# Patient Record
Sex: Male | Born: 1950 | Race: White | Hispanic: No | Marital: Single | Smoking: Never smoker
Health system: Southern US, Community
[De-identification: ages and names within clinical notes are randomized; demographics above are authoritative.]

## PROBLEM LIST (undated history)

## (undated) DIAGNOSIS — K222 Esophageal obstruction: Secondary | ICD-10-CM

## (undated) DIAGNOSIS — F0390 Unspecified dementia without behavioral disturbance: Secondary | ICD-10-CM

## (undated) DIAGNOSIS — R1310 Dysphagia, unspecified: Secondary | ICD-10-CM

## (undated) DIAGNOSIS — K649 Unspecified hemorrhoids: Secondary | ICD-10-CM

## (undated) DIAGNOSIS — K2270 Barrett's esophagus without dysplasia: Secondary | ICD-10-CM

## (undated) DIAGNOSIS — K449 Diaphragmatic hernia without obstruction or gangrene: Secondary | ICD-10-CM

## (undated) DIAGNOSIS — Q909 Down syndrome, unspecified: Secondary | ICD-10-CM

## (undated) DIAGNOSIS — H52209 Unspecified astigmatism, unspecified eye: Secondary | ICD-10-CM

## (undated) DIAGNOSIS — E039 Hypothyroidism, unspecified: Secondary | ICD-10-CM

## (undated) DIAGNOSIS — K219 Gastro-esophageal reflux disease without esophagitis: Secondary | ICD-10-CM

## (undated) HISTORY — DX: Gastro-esophageal reflux disease without esophagitis: K21.9

## (undated) HISTORY — DX: Unspecified hemorrhoids: K64.9

## (undated) HISTORY — DX: Dysphagia, unspecified: R13.10

## (undated) HISTORY — DX: Hypothyroidism, unspecified: E03.9

## (undated) HISTORY — DX: Diaphragmatic hernia without obstruction or gangrene: K44.9

## (undated) HISTORY — PX: OTHER SURGICAL HISTORY: SHX169

## (undated) HISTORY — DX: Esophageal obstruction: K22.2

## (undated) HISTORY — DX: Barrett's esophagus without dysplasia: K22.70

## (undated) HISTORY — DX: Down syndrome, unspecified: Q90.9

## (undated) HISTORY — DX: Unspecified dementia, unspecified severity, without behavioral disturbance, psychotic disturbance, mood disturbance, and anxiety: F03.90

---

## 2007-05-09 ENCOUNTER — Encounter: Payer: Self-pay | Admitting: Gastroenterology

## 2007-05-22 ENCOUNTER — Encounter: Payer: Self-pay | Admitting: Gastroenterology

## 2007-07-17 ENCOUNTER — Encounter: Payer: Self-pay | Admitting: Gastroenterology

## 2008-01-04 ENCOUNTER — Emergency Department (HOSPITAL_COMMUNITY): Admission: EM | Admit: 2008-01-04 | Discharge: 2008-01-04 | Payer: Self-pay | Admitting: Emergency Medicine

## 2008-12-11 DIAGNOSIS — Q909 Down syndrome, unspecified: Secondary | ICD-10-CM

## 2008-12-11 DIAGNOSIS — K649 Unspecified hemorrhoids: Secondary | ICD-10-CM

## 2008-12-11 DIAGNOSIS — E039 Hypothyroidism, unspecified: Secondary | ICD-10-CM | POA: Insufficient documentation

## 2008-12-11 DIAGNOSIS — K222 Esophageal obstruction: Secondary | ICD-10-CM | POA: Insufficient documentation

## 2008-12-11 DIAGNOSIS — K219 Gastro-esophageal reflux disease without esophagitis: Secondary | ICD-10-CM | POA: Insufficient documentation

## 2008-12-11 DIAGNOSIS — K2270 Barrett's esophagus without dysplasia: Secondary | ICD-10-CM

## 2008-12-11 DIAGNOSIS — R1310 Dysphagia, unspecified: Secondary | ICD-10-CM | POA: Insufficient documentation

## 2008-12-11 DIAGNOSIS — K449 Diaphragmatic hernia without obstruction or gangrene: Secondary | ICD-10-CM

## 2009-03-24 ENCOUNTER — Ambulatory Visit: Payer: Self-pay | Admitting: Gastroenterology

## 2009-03-26 LAB — CONVERTED CEMR LAB
ALT: 19 units/L (ref 0–53)
AST: 23 units/L (ref 0–37)
Albumin: 3.4 g/dL — ABNORMAL LOW (ref 3.5–5.2)
Basophils Absolute: 0 10*3/uL (ref 0.0–0.1)
Basophils Relative: 0.2 % (ref 0.0–3.0)
CO2: 32 meq/L (ref 19–32)
Calcium: 8.7 mg/dL (ref 8.4–10.5)
Chloride: 105 meq/L (ref 96–112)
Eosinophils Absolute: 0.1 10*3/uL (ref 0.0–0.7)
Lymphocytes Relative: 18.5 % (ref 12.0–46.0)
MCHC: 33 g/dL (ref 30.0–36.0)
Monocytes Relative: 5.8 % (ref 3.0–12.0)
Neutrophils Relative %: 73.1 % (ref 43.0–77.0)
Potassium: 4.3 meq/L (ref 3.5–5.1)
RBC: 4.2 M/uL — ABNORMAL LOW (ref 4.22–5.81)
RDW: 15.1 % — ABNORMAL HIGH (ref 11.5–14.6)

## 2009-03-27 ENCOUNTER — Ambulatory Visit (HOSPITAL_COMMUNITY): Admission: RE | Admit: 2009-03-27 | Discharge: 2009-03-27 | Payer: Self-pay | Admitting: Gastroenterology

## 2009-03-27 ENCOUNTER — Encounter: Payer: Self-pay | Admitting: Gastroenterology

## 2009-04-01 ENCOUNTER — Encounter (INDEPENDENT_AMBULATORY_CARE_PROVIDER_SITE_OTHER): Payer: Self-pay | Admitting: *Deleted

## 2009-04-01 ENCOUNTER — Encounter: Payer: Self-pay | Admitting: Gastroenterology

## 2009-05-07 ENCOUNTER — Ambulatory Visit: Payer: Self-pay | Admitting: Gastroenterology

## 2009-05-07 ENCOUNTER — Ambulatory Visit (HOSPITAL_COMMUNITY): Admission: RE | Admit: 2009-05-07 | Discharge: 2009-05-07 | Payer: Self-pay | Admitting: Gastroenterology

## 2009-05-25 ENCOUNTER — Encounter (INDEPENDENT_AMBULATORY_CARE_PROVIDER_SITE_OTHER): Payer: Self-pay | Admitting: *Deleted

## 2009-05-25 ENCOUNTER — Encounter: Payer: Self-pay | Admitting: Gastroenterology

## 2009-06-25 ENCOUNTER — Telehealth (INDEPENDENT_AMBULATORY_CARE_PROVIDER_SITE_OTHER): Payer: Self-pay | Admitting: *Deleted

## 2009-06-25 ENCOUNTER — Ambulatory Visit (HOSPITAL_COMMUNITY): Admission: RE | Admit: 2009-06-25 | Discharge: 2009-06-25 | Payer: Self-pay | Admitting: Gastroenterology

## 2009-06-25 ENCOUNTER — Encounter (INDEPENDENT_AMBULATORY_CARE_PROVIDER_SITE_OTHER): Payer: Self-pay | Admitting: *Deleted

## 2009-06-25 ENCOUNTER — Ambulatory Visit: Payer: Self-pay | Admitting: Gastroenterology

## 2009-07-23 ENCOUNTER — Ambulatory Visit: Payer: Self-pay | Admitting: Gastroenterology

## 2009-07-23 ENCOUNTER — Ambulatory Visit (HOSPITAL_COMMUNITY): Admission: RE | Admit: 2009-07-23 | Discharge: 2009-07-23 | Payer: Self-pay | Admitting: Gastroenterology

## 2009-09-22 ENCOUNTER — Telehealth (INDEPENDENT_AMBULATORY_CARE_PROVIDER_SITE_OTHER): Payer: Self-pay | Admitting: *Deleted

## 2009-09-25 ENCOUNTER — Encounter (INDEPENDENT_AMBULATORY_CARE_PROVIDER_SITE_OTHER): Payer: Self-pay | Admitting: *Deleted

## 2009-09-29 ENCOUNTER — Telehealth: Payer: Self-pay | Admitting: Gastroenterology

## 2009-09-30 ENCOUNTER — Encounter (INDEPENDENT_AMBULATORY_CARE_PROVIDER_SITE_OTHER): Payer: Self-pay | Admitting: *Deleted

## 2009-10-21 ENCOUNTER — Telehealth (INDEPENDENT_AMBULATORY_CARE_PROVIDER_SITE_OTHER): Payer: Self-pay | Admitting: *Deleted

## 2009-10-22 ENCOUNTER — Ambulatory Visit (HOSPITAL_COMMUNITY): Admission: RE | Admit: 2009-10-22 | Discharge: 2009-10-22 | Payer: Self-pay | Admitting: Gastroenterology

## 2009-10-22 ENCOUNTER — Ambulatory Visit: Payer: Self-pay | Admitting: Gastroenterology

## 2009-12-22 ENCOUNTER — Emergency Department (HOSPITAL_COMMUNITY): Admission: EM | Admit: 2009-12-22 | Discharge: 2009-12-22 | Payer: Self-pay | Admitting: Emergency Medicine

## 2010-03-23 NOTE — Letter (Signed)
Summary: EGD Instructions  [facility]  [address] [place], [place] [address]   Phone: [phone]  Fax: [phone]       Jeremy Murillo    September 19, 1950    MRN: [id number]       Procedure Day /Date:06/25/09     Arrival Time: 7:00 am       Procedure Time:8:00     Location of Procedure:                     [facility] ( Outpatient Registration)    PREPARATION FOR ENDOSCOPY   On 06/25/09  THE DAY OF THE PROCEDURE:  1.   Nothing to eat or drink is allowed after midnight the night before your procedure.    MEDICATION INSTRUCTIONS  Unless otherwise instructed, you should take regular prescription medications with a small sip of water as early as possible the morning of your procedure.             OTHER INSTRUCTIONS  You will need a responsible adult at least 59 years of age to accompany you and drive you home.   This person must remain in the waiting room during your procedure.  Wear loose fitting clothing that is easily removed.  Leave jewelry and other valuables at home.  However, you may wish to bring a book to read or an iPod/MP3 player to listen to music as you wait for your procedure to start.  Remove all body piercing jewelry and leave at home.  Total time from sign-in until discharge is approximately 2-3 hours.  You should go home directly after your procedure and rest.  You can resume normal activities the day after your procedure.  The day of your procedure you should not:   Drive   Make legal decisions   Operate machinery   Drink alcohol   Return to work  You will receive specific instructions about eating, activities and medications before you leave.    The above instructions have been reviewed and explained to me by   Chales Abrahams CMA Duncan Dull)  May 25, 2009 8:57 AM     I fully understand and can verbalize these instructions over the phone to [facility] and mailed to home Date 05/25/09

## 2010-03-23 NOTE — Assessment & Plan Note (Signed)
History of Present Illness Visit Type: Initial Consult Primary GI MD: Rob Bunting MD Primary Provider: Florentina Jenny, MD Requesting Provider: Florentina Jenny, MD Chief Complaint: dysphagia History of Present Illness:     Very pleasant 60 year old man with Down's syndrome who is here with his niece today. She tells me that he has the mental capacity of 80-year-old. He is unable to really give any reliable history and so she provides that.  niece tells me he has had esophageal narrowing in past.  Used to live in [place], has been living in assisted living.  She believes his weight has decreased.  Speech therapy at facility saw him, has been working with him with eating modifications.    He has not had formal MBSStudy.               Current Medications (verified): 1)  Levothroid 125 Mcg Tabs (Levothyroxine Sodium) .... Take 1 Tablet By Mouth Once A Day 2)  Prilosec 20 Mg Cpdr (Omeprazole) .Marland Kitchen.. 1 By Mouth Two Times A Day 3)  Zocor 5 Mg Tabs (Simvastatin) .Marland Kitchen.. 1 By Mouth Once Daily 4)  Acetaminophen 325 Mg Tabs (Acetaminophen) .... Take 2 Tablets By Mouth Every 6 Hours As Needed For Pain 5)  Pepto-Bismol 262 Mg/80ml Susp (Bismuth Subsalicylate) .... 30 Ml By Mouth Every 4 Hours As Needed 6)  Bactericin 500 Unit/gm Oint (Bacitracin) .... Apply To Rt Anterior Foot Lesion Two Times A Day 7)  Benadryl Maximum Strength 2 % Crea (Diphenhydramine Hcl) .... Apply To Lower Legs Four Times A Day 8)  Mineral Oil Heavy  Oil (Mineral Oil) .... Apply To Both Ears Once Weekly 9)  Cepastat 14.5 Mg Lozg (Throat Lozenges) .... Dissolve 1 Lozenge By Mouth Every 4 Hours As Needed For Sore Throat 10)  Polyethylene Glycol 1000  Powd (Polyethylene Glycol 1000) .Marland KitchenMarland KitchenMarland Kitchen 17 Gm Daily As Needed  Allergies (verified): No Known Drug Allergies  Past History:  Past Medical History: OWNS SYNDROME (ICD-758.0) HEMORRHOIDS (ICD-455.6) ESOPHAGEAL STRICTURE (ICD-530.3) ESOPHAGEAL REFLUX (ICD-530.81) DIAPHRAGMAT HERN  W/O MENTION OBSTRUCTION/GANGREN (ICD-553.3) BARRETTS ESOPHAGUS (ICD-530.85) HYPOTHYROIDISM (ICD-244.9) DYSPHAGIA UNSPECIFIED (ICD-787.20)   Family History: Family History of Heart Disease: father Alzheimers: mother  Social History: single, lives extended care facility, no children, does not drink alcohol or smoke cigarettes.  Review of Systems       Pertinent positive and negative review of systems were noted in the above HPI and GI specific review of systems.  All other review of systems was otherwise negative.   Vital Signs:  Patient profile:   60 year old male Height:      58.5 inches Weight:      107.13 pounds BMI:     22.09 Pulse rate:   68 / minute Pulse rhythm:   regular BP sitting:   102 / 58  (left arm) Cuff size:   regular  Vitals Entered By: June McMurray CMA Duncan Dull) (March 24, 2009 1:38 PM)  Physical Exam  Additional Exam:  Constitutional:age appropriate, clearly has Down syndrome Psychiatric: alert and oriented x1 Eyes: extraocular movements intact Mouth: oropharynx moist, no lesions Neck: supple, no lymphadenopathy Cardiovascular: heart regular rate and rythm Lungs: CTA bilaterally Abdomen: soft, non-tender, non-distended, no obvious ascites, no peritoneal signs, normal bowel sounds Extremities: no lower extremity edema bilaterally Skin: no lesions on visible extremities    Impression & Recommendations:  Problem # 1:  dysphasia, trouble swallowing, weight loss it is very hard to tell exactly what his complaints are, he cannot give any reliable  history. He has been working with a Human resources officer at his care facility and his weight has been increasing somewhat however I do not see that he has had a formal modified barium swallow study. His niece also seems to remember that he has had a narrowing in his esophagus that has had to be stretched in the past. I would like to set him up with modified barium swallow study as well as upper GI, esophagram  examination. Based on this I will potentially proceed with EGD if a esophageal narrowing is noted. You also get a basic set of labs included a CBC, complete metabolic profile.  Other Orders: TLB-CBC Platelet - w/Differential (85025-CBCD) TLB-CMP (Comprehensive Metabolic Pnl) (80053-COMP)  Patient Instructions: 1)  Needs UGI barium test and also modified barium swallow study with speech therapist. 2)  You will get lab test(s) done today (cbc, cmet). 3)  A copy of this information will be sent to Dr. Redmond School. 4)  The medication list was reviewed and reconciled.  All changed / newly prescribed medications were explained.  A complete medication list was provided to the patient / caregiver.  Appended Document: Orders Update/UGI / MBS with Speech    Clinical Lists Changes  Orders: Added new Test order of Barium Swallow, Modified  (Modified BS) - Signed Added new Test order of UGI Series (UGI Series) - Signed

## 2010-03-23 NOTE — Miscellaneous (Signed)
Summary: Orders Update/EGD  Clinical Lists Changes  Orders: Added new Test order of ZEGD (ZEGD) - Signed

## 2010-03-23 NOTE — Procedures (Signed)
Summary: Upper Endoscopy  Patient: Jeremy Murillo Note: All result statuses are Final unless otherwise noted.  Tests: (1) Upper Endoscopy (EGD)   EGD Upper Endoscopy       DONE     [facility]     [address] [place], [place]  [address]           ENDOSCOPY PROCEDURE REPORT           PATIENT:  Jeremy Murillo, Jeremy Murillo  MR#:  [id number]     BIRTHDATE:  May 12, 1950, 58 yrs. old  GENDER:  male     ENDOSCOPIST:  Rachael Fee, MD     PROCEDURE DATE:  06/25/2009     PROCEDURE:  EGD with balloon dilatation     ASA CLASS:  Class III     INDICATIONS:  known distal esophageal stricture (thick mucosal     ring vs focal peptic stricture), last dilated 5 6 weeks ago up to     15mm     MEDICATIONS:  MAC sedation, administered by CRNA     TOPICAL ANESTHETIC:  none     DESCRIPTION OF PROCEDURE:   After the risks benefits and     alternatives of the procedure were thoroughly explained, informed     consent was obtained.  The  endoscope was introduced through the     mouth and advanced to the stomach antrum, without limitations.     The instrument was slowly withdrawn as the mucosa was fully     examined.     <<PROCEDUREIMAGES>>     The previously known thick mucosal ring vs focal peptic stricture     was again located at GE junction above a small hiatal hernia. The     gastroscope was able to be advanced through the stricture this     time. The stricture was disrupted with two bites of forceps and     then dilated up to 16.38mm with CRE TTS balloon held inflated for 1     minute. There was the usual minor self limited bleeding following     dilation (see image1, image5, and image6).  There were anatomic     changes of remote fundoplication (see image3).    Retroflexed     views revealed no abnormalities.    The scope was then withdrawn     from the patient and the procedure completed.           COMPLICATIONS:  None           ENDOSCOPIC IMPRESSION:     1) GE junction, benign  stricture.  Dilated up to 16.43mm after     mucosal biopsies taken to disrupt the stricture.     2) S/p fundoplication remotely.           RECOMMENDATIONS:     Dr. Christella Hartigan' office will arrange for repeat EGD with dilation at     [facility] in 4 weeks.           ______________________________     Rachael Fee, MD           cc: Florentina Jenny, MD           n.     eSIGNED:   Rachael Fee at 06/25/2009 08:36 AM           Shyrl Numbers, [id number]  Note: An exclamation mark (!) indicates a result that was not dispersed into the flowsheet. Document Creation Date:  06/25/2009 8:36 AM _______________________________________________________________________  (1) Order result status: Final Collection or observation date-time: 06/25/2009 08:27 Requested date-time:  Receipt date-time:  Reported date-time:  Referring Physician:   Ordering Physician: Rob Bunting [phone]) Specimen Source:  Source: Launa Grill Order Number: [id number] Lab site:   Appended Document: Upper Endoscopy patty, he will need repeat EGD with dilation, propofol at [facility] in 4 weeks  tahnks  Appended Document: Upper Endoscopy patty, it is easiest to do him first case in morning, can you call family to arrange in next few days so that he can get a first case spot in 4-5 weeks.  Appended Document: Upper Endoscopy appt scheduled and mailed to the pt home

## 2010-03-23 NOTE — Progress Notes (Signed)
Summary: call return  Phone Note Call from Patient Call back at [phone]   Caller: Victorino Dike, neice Call For: Dr. Christella Hartigan Reason for Call: Talk to Nurse Summary of Call: returning your call, Patty Initial call taken by: Vallarie Mare,  September 29, 2009 11:10 AM  Follow-up for Phone Call        called the niece and scheduled the EGD pt instructed and meds reviewed. Follow-up by: Chales Abrahams CMA Duncan Dull),  September 30, 2009 3:21 PM

## 2010-03-23 NOTE — Letter (Signed)
Summary: EGD Instructions  [facility]  [address] [place], [place] [address]   Phone: [phone]  Fax: [phone]       Jeremy Murillo    10/15/1950    MRN: [id number]       Procedure Day /Date:05/07/09     Arrival Time:730 am     Procedure Time:830 am     Location of Procedure:                     _X  _ [facility] ( Outpatient Registration)    PREPARATION FOR ENDOSCOPY   On 05/07/09  THE DAY OF THE PROCEDURE:  1.   No solid foods, milk or milk products are allowed after midnight the night before your procedure.  2.   Do not drink anything colored red or purple.  Avoid juices with pulp.  No orange juice.  3.  You may not have anything to eat or drink after midnight before your procedure.                                                                                                CLEAR LIQUIDS INCLUDE: Water Jello Ice Popsicles Tea (sugar ok, no milk/cream) Powdered fruit flavored drinks Coffee (sugar ok, no milk/cream) Gatorade Juice: apple, white grape, white cranberry  Lemonade Clear bullion, consomm, broth Carbonated beverages (any kind) Strained chicken noodle soup Hard Candy   MEDICATION INSTRUCTIONS  Unless otherwise instructed, you should take regular prescription medications with a small sip of water as early as possible the morning of your procedure.               OTHER INSTRUCTIONS  You will need a responsible adult at least 59 years of age to accompany you and drive you home.   This person must remain in the waiting room during your procedure.  Wear loose fitting clothing that is easily removed.  Leave jewelry and other valuables at home.  However, you may wish to bring a book to read or an iPod/MP3 player to listen to music as you wait for your procedure to start.  Remove all body piercing jewelry and leave at home.  Total time from sign-in until discharge is approximately 2-3 hours.  You should go home  directly after your procedure and rest.  You can resume normal activities the day after your procedure.  The day of your procedure you should not:   Drive   Make legal decisions   Operate machinery   Drink alcohol   Return to work  You will receive specific instructions about eating, activities and medications before you leave.    The above instructions have been reviewed and explained to me by   Chales Abrahams CMA (AAMA)  April 01, 2009 11:10 AM     I fully understand and can verbalize these instructions over the phone to pt's sister. Date 04/01/09     Appended Document: EGD Instructions mailed to pt's sister  Appended Document: EGD Instructions faxed to [facility]

## 2010-03-23 NOTE — Letter (Signed)
Summary: Endoscopy Letter  [facility]  [address] [place], [place] [address]   Phone: [phone]  Fax: [phone]      September 25, 2009 MRN: [id number]   [facility] [place] [address] [place], [place]  [address]   Dear Mr. CAPOZZI,   According to your medical record, it is time for you to schedule an Endoscopy at [facility]. Endoscopic screening is recommended for patients with certain upper digestive tract conditions because of associated increased risk for cancers of the upper digestive system.  This letter has been generated based on the recommendations made at the time of your prior procedure. If you feel that in your particular situation this may no longer apply, please contact our office.  Please call our office at [phone]) to schedule this appointment or to update your records at your earliest convenience.  Thank you for cooperating with [place] to provide you with the very best care possible.   Sincerely,    [facility] [phone]

## 2010-03-23 NOTE — Procedures (Signed)
Summary: Upper Endoscopy  Patient: [name] Note: All result statuses are Final unless otherwise noted.  Tests: (1) Upper Endoscopy (EGD)   EGD Upper Endoscopy       DONE     [facility]     [address] [place], [place]  [address]           ENDOSCOPY PROCEDURE REPORT           PATIENT:  Jeremy Murillo, Jeremy Murillo  MR#:  [id number]     BIRTHDATE:  November 28, 1950, 59 yrs. old  GENDER:  male     ENDOSCOPIST:  Rachael Fee, MD     PROCEDURE DATE:  10/22/2009     PROCEDURE:  EGD with balloon dilatation     ASA CLASS:  Class III     INDICATIONS:  GE junction benign stricture, last dilated 3 months     ago to 18mm     MEDICATIONS:  MAC sedation, administered by CRNA     TOPICAL ANESTHETIC:  none           DESCRIPTION OF PROCEDURE:   After the risks benefits and     alternatives of the procedure were thoroughly explained, informed     consent was obtained.  The  endoscope was introduced through the     mouth and advanced to the stomach body, without limitations.  The     instrument was slowly withdrawn as the mucosa was fully examined.     <<PROCEDUREIMAGES>>     The focal, benign GE junction stricture (peptic vs. thick     Schatzki's ring) was again located. Lumen was 12mm and it was     dilated up to 18mm with CRE TTS balloon held inflated for one     minute. There was the usual minor mucosal tear and self limited     oozing of blood (see image1, image3, and image5).    Retroflexed     views revealed no abnormalities.    The scope was then withdrawn     from the patient and the procedure completed.           COMPLICATIONS:  None           ENDOSCOPIC IMPRESSION:     1) Benign GE junction sticture dilated up to 18mm           RECOMMENDATIONS:     Family knows to call Dr. Christella Hartigan' office if dysphagia symptoms     return. Would repeat EGD, dilation at [facility] with MAC sedation if that     occurs.           REPEAT EXAM:  as needed           ______________________________     Rachael Fee, MD           n.     eSIGNED:   Rachael Fee at 10/22/2009 11:49 AM           Shyrl Numbers, [id number]  Note: An exclamation mark (!) indicates a result that was not dispersed into the flowsheet. Document Creation Date: 10/22/2009 11:50 AM _______________________________________________________________________  (1) Order result status: Final Collection or observation date-time: 10/22/2009 11:42 Requested date-time:  Receipt date-time:  Reported date-time:  Referring Physician:   Ordering Physician: Rob Bunting [phone]) Specimen Source:  Source: Launa Grill Order Number: [id number] Lab site:

## 2010-03-23 NOTE — Progress Notes (Signed)
Summary: Appt time change  Phone Note Outgoing Call   Call placed by: Chales Abrahams CMA Duncan Dull),  October 21, 2009 3:39 PM Summary of Call: Feliberto Harts the pt's guardian and informed her that the pt does not need to arrive at [facility] for his procedure until 930 due to a schedule change on her message machine I asked her to call me and let me know she got the message.  karen at endo has changed the appt time. Initial call taken by: Chales Abrahams CMA Duncan Dull),  October 21, 2009 3:42 PM  Follow-up for Phone Call        pt guardian called back and is agreeable to the time change Follow-up by: Chales Abrahams CMA Duncan Dull),  October 21, 2009 3:49 PM

## 2010-03-23 NOTE — Letter (Signed)
Summary: [facility]   Imported By: Lester Holliday 05/11/2009 08:58:21  _____________________________________________________________________  External Attachment:    Type:   Image     Comment:   External Document

## 2010-03-23 NOTE — Procedures (Signed)
Summary: Upper Endoscopy  Patient: Jeremy Murillo Note: All result statuses are Final unless otherwise noted.  Tests: (1) Upper Endoscopy (EGD)   EGD Upper Endoscopy       DONE     [facility]     [address] [place], [place]  [address]           ENDOSCOPY PROCEDURE REPORT           PATIENT:  Jeremy Murillo, Jeremy Murillo  MR#:  [id number]     BIRTHDATE:  February 14, 1951, 58 yrs. old  GENDER:  male           ENDOSCOPIST:  Rachael Fee, MD     referred by:  Florentina Jenny, MD           PROCEDURE DATE:  05/07/2009     PROCEDURE:  EGD with balloon dilatation     ASA CLASS:  Class III     INDICATIONS:  chronic, benign, esophageal stricture; dilated     numerous times by previous out of state gastroenterologist,     recurrent dysphagia.           MEDICATIONS:  MAC sedation, administered by CRNA     TOPICAL ANESTHETIC:  none           DESCRIPTION OF PROCEDURE:   After the risks benefits and     alternatives of the procedure were thoroughly explained, informed     consent was obtained.  The  endoscope was introduced through the     mouth and advanced to the second portion of the duodenum, without     limitations.  The instrument was slowly withdrawn as the mucosa     was fully examined.     <<PROCEDUREIMAGES>>           There was a thick Schatzki's ring vs. focal peptic stricture at GE     junction. Below this there was a slight hiatal hernia and it     appeared that he has had a fundoplication sugically. The stricture     was just able to be passed with adult upper gastroscope (9-37mm     lumen) and was dilated with a CRE, TTS balloon inflated up to     15mm. This resulted in no bleeding or mucosal tear and so I     elected to disrupt the ring of thick tissue with single forceps     biopsy and then repeat balloon dilation up to 15mm for one minute.     There was the usual resultant minor self limited oozing and     mucosal tear (see image1, image4, image7, image8, and image9).     Otherwise the examination was normal (see image3 and image2).     Retroflexed views revealed no abnormalities.    The scope was then     withdrawn from the patient and the procedure completed.           COMPLICATIONS:  None           ENDOSCOPIC IMPRESSION:     1) Thick Schatzki's ring vs focal peptic stricture. This was     disrupted with forceps and then dilated up to 15mm with CRE TTS     balloon.     2) Otherwise normal examination           RECOMMENDATIONS:     My office will arrange for repeat EGD with dilation in 3-4     weeks.  He will stay on prilosec twice daily.           ______________________________     Rachael Fee, MD           n.     eSIGNED:   Rachael Fee at 05/07/2009 08:52 AM           Shyrl Numbers, [id number]  Note: An exclamation mark (!) indicates a result that was not dispersed into the flowsheet. Document Creation Date: 05/07/2009 8:52 AM _______________________________________________________________________  (1) Order result status: Final Collection or observation date-time: 05/07/2009 08:37 Requested date-time:  Receipt date-time:  Reported date-time:  Referring Physician:   Ordering Physician: Rob Bunting [phone]) Specimen Source:  Source: Launa Grill Order Number: [id number] Lab site:   Appended Document: Upper Endoscopy patty,  he needs repeat EGD with balloon dilation at [facility]/propofol in 3-4 weeks  Appended Document: Upper Endoscopy reminder flag to be sent  Appended Document: Upper Endoscopy called the home number and the guardian asked me to call his sister to schedule the EGD Victorino Dike [phone].  left message on machine to call back   Appended Document: Upper Endoscopy pt scheduled and instructed meds reviewed spoke with Victorino Dike [phone]

## 2010-03-23 NOTE — Procedures (Signed)
Summary: Upper Endoscopy  Patient: Jeremy Murillo Note: All result statuses are Final unless otherwise noted.  Tests: (1) Upper Endoscopy (EGD)   EGD Upper Endoscopy       DONE     [facility]     [address] [place], [place]  [address]           ENDOSCOPY PROCEDURE REPORT           PATIENT:  [name], [name]  MR#:  [id number]     BIRTHDATE:  10/21/1950, 58 yrs. old  GENDER:  male     ENDOSCOPIST:  Rachael Fee, MD     PROCEDURE DATE:  07/23/2009     PROCEDURE:  EGD with balloon dilatation     ASA CLASS:  Class III     INDICATIONS:  known GE junction stricture, dysphagia.  In past 2-3     months, has had dilation twice, most recently was 4 weeks ago up     to 16.51mm.     MEDICATIONS:  MAC sedation, administered by CRNA     TOPICAL ANESTHETIC:  none           DESCRIPTION OF PROCEDURE:   After the risks benefits and     alternatives of the procedure were thoroughly explained, informed     consent was obtained.  The  endoscope was introduced through the     mouth and advanced to the stomach body, without limitations.  The     instrument was slowly withdrawn as the mucosa was fully examined.     <<PROCEDUREIMAGES>>     The GE junction stricture was noted above a 3-4cm hiatal hernia.     There was evidence of previous fundoplication surgery. The     stricture was benign, focal (a thick Schatzki's ring vs focal     peptic stricture) and was dilated up to 18mm with CRE TTS balloon     held inflated for 1 minute (see image3, image1, image4, image6,     and image7).  Otherwise the examination was normal (see image5).     Retroflexed views revealed no abnormalities.    The scope was then     withdrawn from the patient and the procedure completed.     COMPLICATIONS:  None           ENDOSCOPIC IMPRESSION:     1) Benign GE junction stricture dilated again (up to 18mm with     CRE Balloon).     2) Small hiatal hernia and previous fundoplication     3) Otherwise  normal examination           RECOMMENDATIONS:     Dr. Christella Hartigan' office will arrange for repeat EGD at [facility] with     balloon dilation in 2 months.           ______________________________     Rachael Fee, MD           n.     eSIGNED:   Rachael Fee at 07/23/2009 07:46 AM           Shyrl Numbers, [id number]  Note: An exclamation mark (!) indicates a result that was not dispersed into the flowsheet. Document Creation Date: 07/23/2009 7:47 AM _______________________________________________________________________  (1) Order result status: Final Collection or observation date-time: 07/23/2009 07:38 Requested date-time:  Receipt date-time:  Reported date-time:  Referring Physician:   Ordering Physician: Rob Bunting [phone]) Specimen Source:  Source: Launa Grill  Order Number: [id number] Lab site:   Appended Document: Upper Endoscopy repeat egd at [facility] with balloon dilation in 2 months  Appended Document: Upper Endoscopy flag to schedule in 2 months

## 2010-03-23 NOTE — Procedures (Signed)
Summary: EGD/[facility]  EGD/[facility]   Imported By: Lester Nunda 05/11/2009 09:01:10  _____________________________________________________________________  External Attachment:    Type:   Image     Comment:   External Document

## 2010-03-23 NOTE — Progress Notes (Signed)
Summary: Repeat EGD  Phone Note Outgoing Call Call back at [phone]   Call placed by: Chales Abrahams CMA Duncan Dull),  September 22, 2009 9:07 AM Call placed to: Victorino Dike Summary of Call: Hazeline Junker to schedule repeat Procedure left message on machine to call back  Initial call taken by: Chales Abrahams CMA Duncan Dull),  September 22, 2009 9:07 AM  Follow-up for Phone Call        left message on machine to call back Chales Abrahams CMA Duncan Dull)  September 24, 2009 3:43 PM   letter mailed Follow-up by: Chales Abrahams CMA Duncan Dull),  September 25, 2009 2:53 PM

## 2010-03-23 NOTE — Miscellaneous (Signed)
Summary: Orders Update/EGD  Clinical Lists Changes  Orders: Added new Test order of ZEGD Balloon Dil (ZEGD Balloon) - Signed

## 2010-03-23 NOTE — Letter (Signed)
Summary: [facility]   Imported By: Lester Dacula 05/11/2009 08:59:15  _____________________________________________________________________  External Attachment:    Type:   Image     Comment:   External Document

## 2010-03-23 NOTE — Letter (Signed)
Summary: EGD Instructions  [facility]  [address] [place], [place] [address]   Phone: [phone]  Fax: [phone]       Jeremy Murillo    24-Jul-1950    MRN: [id number]       Procedure Day /Date:07/23/09     Arrival Time: 630 am     Procedure Time:730 am     Location of Procedure:                     [facility] ( Outpatient Registration)   Please arrive at [facility] for your preappointment on 07/22/09 at 1 pm.   PREPARATION FOR ENDOSCOPY   On 07/23/09  THE DAY OF THE PROCEDURE:  1.   Nothing to eat or drink allowed after midnight the night before your procedure.                                                                                                      MEDICATION INSTRUCTIONS  Unless otherwise instructed, you should take regular prescription medications with a small sip of water as early as possible the morning of your procedure.             OTHER INSTRUCTIONS  You will need a responsible adult at least 59 years of age to accompany you and drive you home.   This person must remain in the waiting room during your procedure.  Wear loose fitting clothing that is easily removed.  Leave jewelry and other valuables at home.  However, you may wish to bring a book to read or an iPod/MP3 player to listen to music as you wait for your procedure to start.  Remove all body piercing jewelry and leave at home.  Total time from sign-in until discharge is approximately 2-3 hours.  You should go home directly after your procedure and rest.  You can resume normal activities the day after your procedure.  The day of your procedure you should not:   Drive   Make legal decisions   Operate machinery   Drink alcohol   Return to work  You will receive specific instructions about eating, activities and medications before you leave.    The above instructions have been reviewed and explained to me by   Chales Abrahams  CMA Duncan Dull)  Jun 25, 2009 9:16 AM     I fully understand and can verbalize these instructions over the phone mailed tohome Date 06/25/09

## 2010-03-23 NOTE — Letter (Signed)
Summary: EGD Instructions  [facility]  [address] [place], [place] [address]   Phone: [phone]  Fax: [phone]       Jeremy Murillo    03-11-1950    MRN: [id number]       Procedure Day /Date: 10/22/09 THURS     Arrival Time: 7 am     Procedure Time:8 am     Location of Procedure:                     [facility] ( Outpatient Registration)   PREPARATION FOR ENDOSCOPY   On 10/22/09  THE DAY OF THE PROCEDURE:  1.   No solid foods, milk or milk products are allowed after midnight the night before your procedure.  2.   Do not drink anything colored red or purple.  Avoid juices with pulp.  No orange juice.  3.  You may drink clear liquids until 4 am, which is 4 hours before your procedure.                                                                                                CLEAR LIQUIDS INCLUDE: Water Jello Ice Popsicles Tea (sugar ok, no milk/cream) Powdered fruit flavored drinks Coffee (sugar ok, no milk/cream) Gatorade Juice: apple, white grape, white cranberry  Lemonade Clear bullion, consomm, broth Carbonated beverages (any kind) Strained chicken noodle soup Hard Candy   MEDICATION INSTRUCTIONS  Unless otherwise instructed, you should take regular prescription medications with a small sip of water as early as possible the morning of your procedure.             OTHER INSTRUCTIONS  You will need a responsible adult at least 59 years of age to accompany you and drive you home.   This person must remain in the waiting room during your procedure.  Wear loose fitting clothing that is easily removed.  Leave jewelry and other valuables at home.  However, you may wish to bring a book to read or an iPod/MP3 player to listen to music as you wait for your procedure to start.  Remove all body piercing jewelry and leave at home.  Total time from sign-in until discharge is approximately 2-3 hours.  You should go home  directly after your procedure and rest.  You can resume normal activities the day after your procedure.  The day of your procedure you should not:   Drive   Make legal decisions   Operate machinery   Drink alcohol   Return to work  You will receive specific instructions about eating, activities and medications before you leave.    The above instructions have been reviewed and explained to me by  Chales Abrahams CMA Duncan Dull)  September 30, 2009 3:20 PM     I fully understand and can verbalize these instructions over the phone mailed to home Date 09/30/09

## 2010-03-23 NOTE — Progress Notes (Signed)
Summary: EGD  Phone Note Outgoing Call Call back at [facility] Phone [phone]   Call placed by: Chales Abrahams CMA Duncan Dull),  Jun 25, 2009 9:13 AM Summary of Call: pt scheduled for repeat EGD with Dil propofol, previsit appt on 07/22/09 1 pm.   Initial call taken by: Chales Abrahams CMA Duncan Dull),  Jun 25, 2009 9:13 AM  Follow-up for Phone Call        Spoke with the guardian Loraine Leriche and he request I call his sister Victorino Dike and give her the info.  I spoke with Victorino Dike and she was instructed and info mailed to her. Follow-up by: Chales Abrahams CMA Duncan Dull),  Jun 29, 2009 9:12 AM

## 2010-03-23 NOTE — Miscellaneous (Signed)
Summary: [facility]  [facility]   Imported By: Lester Norway 03/27/2009 10:22:47  _____________________________________________________________________  External Attachment:    Type:   Image     Comment:   External Document

## 2010-05-03 ENCOUNTER — Ambulatory Visit
Admission: RE | Admit: 2010-05-03 | Discharge: 2010-05-03 | Disposition: A | Discharge status: Home / Self Care | Payer: Medicare Other | Source: Ambulatory Visit | Attending: Family Medicine | Admitting: Family Medicine

## 2010-05-03 ENCOUNTER — Other Ambulatory Visit: Payer: Self-pay | Admitting: Family Medicine

## 2010-05-03 DIAGNOSIS — Q909 Down syndrome, unspecified: Secondary | ICD-10-CM

## 2010-05-03 DIAGNOSIS — M532X1 Spinal instabilities, occipito-atlanto-axial region: Secondary | ICD-10-CM

## 2010-05-04 LAB — URINALYSIS, ROUTINE W REFLEX MICROSCOPIC
Bilirubin Urine: NEGATIVE
Glucose, UA: NEGATIVE mg/dL
Ketones, ur: NEGATIVE mg/dL
Nitrite: NEGATIVE
pH: 6 (ref 5.0–8.0)

## 2010-05-04 LAB — COMPREHENSIVE METABOLIC PANEL
ALT: 26 U/L (ref 0–53)
Calcium: 9.1 mg/dL (ref 8.4–10.5)
GFR calc Af Amer: >60 mL/min (ref >60)
Glucose, Bld: 102 mg/dL — ABNORMAL HIGH (ref 70–99)
Sodium: 139 mEq/L (ref 135–145)
Total Protein: 7.5 g/dL (ref 6.0–8.3)

## 2010-05-04 LAB — URINE CULTURE
Colony Count: 60000
Culture  Setup Time: 201111012206

## 2010-05-04 LAB — CBC
MCH: 32.1 pg (ref 26.0–34.0)
MCHC: 34.1 g/dL (ref 30.0–36.0)
Platelets: 218 10*3/uL (ref 150–400)
RDW: 15 % (ref 11.5–15.5)

## 2010-05-04 LAB — DIFFERENTIAL
Basophils Absolute: 0 10*3/uL (ref 0.0–0.1)
Basophils Relative: 0 % (ref 0–1)
Eosinophils Absolute: 0.1 10*3/uL (ref 0.0–0.7)
Monocytes Relative: 7 % (ref 3–12)
Neutrophils Relative %: 80 % — ABNORMAL HIGH (ref 43–77)

## 2010-05-04 LAB — LIPASE, BLOOD: Lipase: 50 U/L (ref 11–59)

## 2010-05-21 ENCOUNTER — Other Ambulatory Visit: Payer: Self-pay | Admitting: Family Medicine

## 2010-05-21 DIAGNOSIS — E049 Nontoxic goiter, unspecified: Secondary | ICD-10-CM

## 2010-06-03 ENCOUNTER — Ambulatory Visit
Admission: RE | Admit: 2010-06-03 | Discharge: 2010-06-03 | Disposition: A | Discharge status: Home / Self Care | Payer: Medicare Other | Source: Ambulatory Visit | Attending: Family Medicine | Admitting: Family Medicine

## 2010-06-03 DIAGNOSIS — E049 Nontoxic goiter, unspecified: Secondary | ICD-10-CM

## 2010-10-16 ENCOUNTER — Emergency Department (HOSPITAL_COMMUNITY): Payer: Medicare HMO

## 2010-10-16 ENCOUNTER — Emergency Department (HOSPITAL_COMMUNITY)
Admission: EM | Admit: 2010-10-16 | Discharge: 2010-10-16 | Disposition: A | Discharge status: Home / Self Care | Payer: Medicare HMO | Attending: Emergency Medicine | Admitting: Emergency Medicine

## 2010-10-16 DIAGNOSIS — E039 Hypothyroidism, unspecified: Secondary | ICD-10-CM | POA: Insufficient documentation

## 2010-10-16 DIAGNOSIS — E7800 Pure hypercholesterolemia, unspecified: Secondary | ICD-10-CM | POA: Insufficient documentation

## 2010-10-16 DIAGNOSIS — Q909 Down syndrome, unspecified: Secondary | ICD-10-CM | POA: Insufficient documentation

## 2010-10-16 DIAGNOSIS — K219 Gastro-esophageal reflux disease without esophagitis: Secondary | ICD-10-CM | POA: Insufficient documentation

## 2010-10-16 DIAGNOSIS — R1310 Dysphagia, unspecified: Secondary | ICD-10-CM | POA: Insufficient documentation

## 2010-10-16 DIAGNOSIS — J029 Acute pharyngitis, unspecified: Secondary | ICD-10-CM | POA: Insufficient documentation

## 2010-10-16 DIAGNOSIS — K2270 Barrett's esophagus without dysplasia: Secondary | ICD-10-CM | POA: Insufficient documentation

## 2010-11-16 ENCOUNTER — Encounter: Payer: Self-pay | Admitting: Gastroenterology

## 2010-11-16 ENCOUNTER — Telehealth: Payer: Self-pay

## 2010-11-16 ENCOUNTER — Ambulatory Visit (INDEPENDENT_AMBULATORY_CARE_PROVIDER_SITE_OTHER): Payer: Medicare Other | Admitting: Gastroenterology

## 2010-11-16 VITALS — BP 100/80 | HR 68 | Ht <= 58 in | Wt 134.0 lb

## 2010-11-16 DIAGNOSIS — R1310 Dysphagia, unspecified: Secondary | ICD-10-CM

## 2010-11-16 DIAGNOSIS — K222 Esophageal obstruction: Secondary | ICD-10-CM

## 2010-11-16 NOTE — Patient Instructions (Signed)
You will be set up for an upper endoscopy at [facility] for esophageal stricture, dysphagia balloon dilation (with propofol). Your niece will need to be present at the time of the procedure to help with consenting for the procedure Jeremy Murillo mobile [phone], home [phone]) For now, chew your food well, eat slowly and take small bites.
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
## 2010-11-16 NOTE — Telephone Encounter (Signed)
You will be set up for an upper endoscopy at [facility] for esophageal stricture, dysphagia balloon dilation (with propofol).  Your niece will need to be present at the time of the procedure to help with consenting for the procedure Victorino Dike mobile [phone], home [phone])  Call to set up

## 2010-11-24 LAB — URINALYSIS, ROUTINE W REFLEX MICROSCOPIC
Leukocytes, UA: NEGATIVE
Protein, ur: 30 — AB
Urobilinogen, UA: 1

## 2010-11-24 LAB — URINE MICROSCOPIC-ADD ON

## 2010-11-24 LAB — CBC
MCHC: 33.7
MCV: 90
Platelets: 160

## 2010-11-24 LAB — POCT I-STAT, CHEM 8
Calcium, Ion: 1.05 — ABNORMAL LOW
Creatinine, Ser: 1.1
Glucose, Bld: 108 — ABNORMAL HIGH
HCT: 34 — ABNORMAL LOW
Hemoglobin: 11.6 — ABNORMAL LOW

## 2010-11-24 LAB — DIFFERENTIAL
Basophils Relative: 2 — ABNORMAL HIGH
Eosinophils Absolute: 0
Neutrophils Relative %: 76

## 2010-11-24 NOTE — Telephone Encounter (Signed)
Pt niece Victorino Dike has been notified of the pt procedure and instructions, they have also been mailed to the group home.

## 2010-12-10 ENCOUNTER — Telehealth: Payer: Self-pay | Admitting: Gastroenterology

## 2010-12-15 NOTE — Telephone Encounter (Signed)
Pt instructions were refaxed to the given number

## 2010-12-15 NOTE — Telephone Encounter (Signed)
Pt procedure is 01/06/11 pt family and group home was notified on  11/16/10 instructions were mailed to the group home,  Left message on machine to call back

## 2011-01-06 ENCOUNTER — Ambulatory Visit (HOSPITAL_COMMUNITY)
Admission: RE | Admit: 2011-01-06 | Discharge: 2011-01-06 | Disposition: A | Discharge status: Home Health | Payer: Medicare HMO | Source: Ambulatory Visit | Attending: Gastroenterology | Admitting: Gastroenterology

## 2011-01-06 ENCOUNTER — Ambulatory Visit (HOSPITAL_COMMUNITY): Payer: Medicare HMO | Admitting: Anesthesiology

## 2011-01-06 ENCOUNTER — Encounter (HOSPITAL_COMMUNITY): Payer: Self-pay | Admitting: Gastroenterology

## 2011-01-06 ENCOUNTER — Encounter (HOSPITAL_COMMUNITY)
Admission: RE | Disposition: A | Discharge status: Home Health | Payer: Self-pay | Source: Ambulatory Visit | Attending: Gastroenterology

## 2011-01-06 ENCOUNTER — Encounter: Payer: Medicare Other | Admitting: Gastroenterology

## 2011-01-06 ENCOUNTER — Encounter (HOSPITAL_COMMUNITY): Payer: Self-pay | Admitting: Anesthesiology

## 2011-01-06 DIAGNOSIS — K219 Gastro-esophageal reflux disease without esophagitis: Secondary | ICD-10-CM | POA: Insufficient documentation

## 2011-01-06 DIAGNOSIS — K222 Esophageal obstruction: Secondary | ICD-10-CM | POA: Insufficient documentation

## 2011-01-06 DIAGNOSIS — E039 Hypothyroidism, unspecified: Secondary | ICD-10-CM | POA: Insufficient documentation

## 2011-01-06 DIAGNOSIS — R1310 Dysphagia, unspecified: Secondary | ICD-10-CM

## 2011-01-06 DIAGNOSIS — Q909 Down syndrome, unspecified: Secondary | ICD-10-CM | POA: Insufficient documentation

## 2011-01-06 DIAGNOSIS — K449 Diaphragmatic hernia without obstruction or gangrene: Secondary | ICD-10-CM | POA: Insufficient documentation

## 2011-01-06 SURGERY — ESOPHAGOGASTRODUODENOSCOPY (EGD) WITH PROPOFOL
Anesthesia: Monitor Anesthesia Care

## 2011-01-06 MED ORDER — MIDAZOLAM HCL 5 MG/5ML IJ SOLN
INTRAMUSCULAR | Status: DC | PRN
  Administered 2011-01-06: 2 mg via INTRAVENOUS

## 2011-01-06 MED ORDER — PROPOFOL 10 MG/ML IV EMUL
INTRAVENOUS | Status: DC | PRN
  Administered 2011-01-06: 25 ug/kg/min via INTRAVENOUS

## 2011-01-06 MED ORDER — PROPOFOL 10 MG/ML IV EMUL
INTRAVENOUS | Status: DC | PRN
  Administered 2011-01-06: 60 mg via INTRAVENOUS
  Administered 2011-01-06: 20 mg via INTRAVENOUS

## 2011-01-06 MED ORDER — FENTANYL CITRATE 0.05 MG/ML IJ SOLN: 25.0000 ug | INTRAMUSCULAR | Status: DC | PRN

## 2011-01-06 MED ORDER — FENTANYL CITRATE 0.05 MG/ML IJ SOLN
INTRAMUSCULAR | Status: DC | PRN
  Administered 2011-01-06: 50 ug via INTRAVENOUS

## 2011-01-06 MED ORDER — LACTATED RINGERS IV SOLN
INTRAVENOUS | Status: DC
  Administered 2011-01-06: 09:00:00 via INTRAVENOUS

## 2011-01-06 NOTE — Transfer of Care (Signed)
Immediate Anesthesia Transfer of Care Note  Patient: Jeremy Murillo  Procedure(s) Performed:  ESOPHAGOGASTRODUODENOSCOPY (EGD) WITH PROPOFOL  Patient Location: PACU  Anesthesia Type: MAC  Level of Consciousness: awake  Airway & Oxygen Therapy: Patient connected to nasal cannula oxygen  Post-op Assessment: Report given to PACU RN, Post -op Vital signs reviewed and stable and Patient moving all extremities  Post vital signs: Reviewed and stable  Complications: No apparent anesthesia complications

## 2011-01-06 NOTE — Anesthesia Preprocedure Evaluation (Signed)
Anesthesia Evaluation  Patient identified by MRN, date of birth, ID band Patient awake    Reviewed: Allergy & Precautions, H&P , NPO status , Patient's Chart, lab work & pertinent test results  Airway Mallampati: II TM Distance: <3 FB Neck ROM: Full    Dental   Pulmonary neg pulmonary ROS,    Pulmonary exam normal       Cardiovascular neg cardio ROS     Neuro/Psych Downs syndrome Negative Neurological ROS  Negative Psych ROS   GI/Hepatic negative GI ROS, Neg liver ROS, GERD-  ,  Endo/Other  Negative Endocrine ROSHypothyroidism   Renal/GU negative Renal ROS  Genitourinary negative   Musculoskeletal negative musculoskeletal ROS (+)   Abdominal Normal abdominal exam  (+)   Peds negative pediatric ROS (+)  Hematology negative hematology ROS (+)   Anesthesia Other Findings   Reproductive/Obstetrics negative OB ROS                           Anesthesia Physical Anesthesia Plan  ASA: II  Anesthesia Plan: MAC   Post-op Pain Management:    Induction: Intravenous  Airway Management Planned: Mask  Additional Equipment:   Intra-op Plan:   Post-operative Plan:   Informed Consent: I have reviewed the patients History and Physical, chart, labs and discussed the procedure including the risks, benefits and alternatives for the proposed anesthesia with the patient or authorized representative who has indicated his/her understanding and acceptance.   Dental advisory given  Plan Discussed with: CRNA  Anesthesia Plan Comments:         Anesthesia Quick Evaluation

## 2011-01-06 NOTE — H&P (Signed)
  HPI: This is a mand with downs, h/o esophageal stricture.  Recurrent dysphagia    Past Medical History  Diagnosis Date  . Down's syndrome   . Unspecified hemorrhoids without mention of complication   . Stricture and stenosis of esophagus   . Esophageal reflux   . Diaphragmatic hernia without mention of obstruction or gangrene   . Barrett's esophagus   . Unspecified hypothyroidism   . Dysphagia, unspecified     History reviewed. No pertinent past surgical history.  No current facility-administered medications for this encounter.    Allergies as of 12/02/2010  . (No Known Allergies)    History reviewed. No pertinent family history.  History   Social History  . Marital Status: Single    Spouse Name: N/A    Number of Children: N/A  . Years of Education: N/A   Occupational History  . Disabled    Social History Main Topics  . Smoking status: Never Smoker   . Smokeless tobacco: Never Used  . Alcohol Use: No  . Drug Use: No  . Sexually Active: Not on file   Other Topics Concern  . Not on file   Social History Narrative  . No narrative on file      Physical Exam: BP 120/78  Temp(Src) 97.6 F (36.4 C) (Oral)  Resp 20  Ht 4\' 10"  (1.473 m)  Wt 124 lb (56.246 kg)  BMI 25.92 kg/m2 Constitutional: generally well-appearing Psychiatric: alert and oriented x3 Abdomen: soft, nontender, nondistended, no obvious ascites, no peritoneal signs, normal bowel sounds     Assessment and plan: 60 y.o. male with dysphagai, known esophageal stricture  For EGD with dilation today

## 2011-05-06 ENCOUNTER — Other Ambulatory Visit: Payer: Self-pay | Admitting: Family Medicine

## 2011-05-06 ENCOUNTER — Ambulatory Visit
Admission: RE | Admit: 2011-05-06 | Discharge: 2011-05-06 | Disposition: A | Discharge status: Home / Self Care | Payer: Medicare HMO | Source: Ambulatory Visit | Attending: Family Medicine | Admitting: Family Medicine

## 2011-05-06 DIAGNOSIS — R52 Pain, unspecified: Secondary | ICD-10-CM

## 2011-12-20 IMAGING — RF DG UGI W/ KUB
19 of 24 series · 19 of 24 positions shown · non-contrast
Comparison: None

CLINICAL DATA: 58-year-old male with recent weight loss and
dysphagia.  History of Barrett's esophagus and esophageal
strictures with previous dilatation.

UPPER GI SERIES WITH KUB
TECHNIQUE: Routine upper GI series was performed with thin barium.
A 13 mm barium tablet was administered.
Fluoroscopy Time: 4.1 minutes

[Series 1: run · 1 of 2 slices shown (1 of 19)]
[im 1/2]
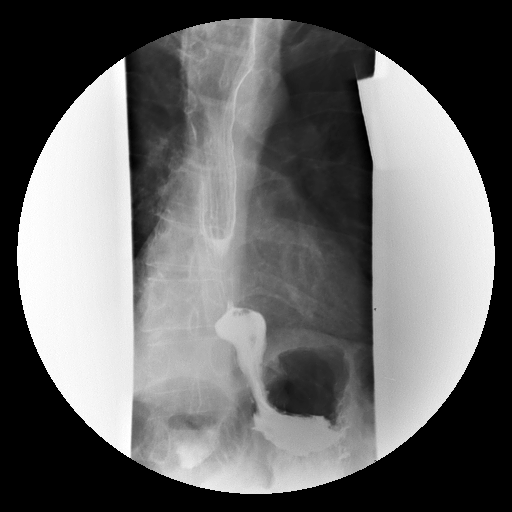

[Series 2: run · 1 of 1 slices shown (2 of 19)]
[im 1/1]
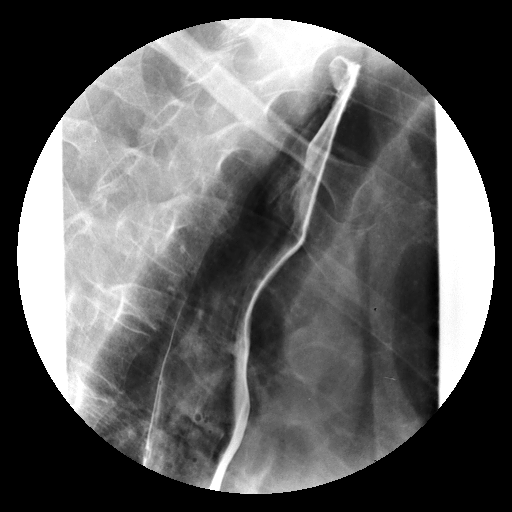

[Series 4: run · 1 of 1 slices shown (3 of 19)]
[im 1/1]
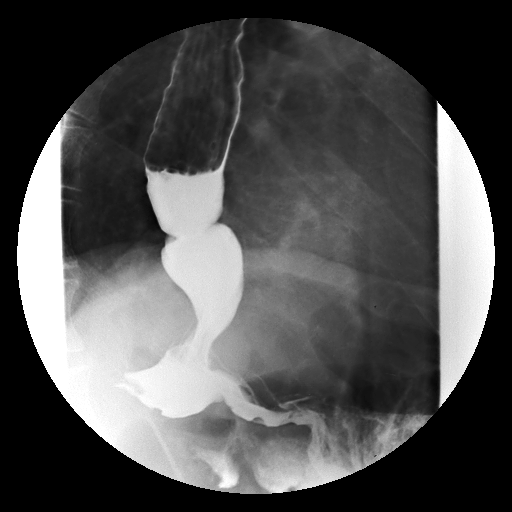

[Series 5: run · 1 of 1 slices shown (4 of 19)]
[im 1/1]
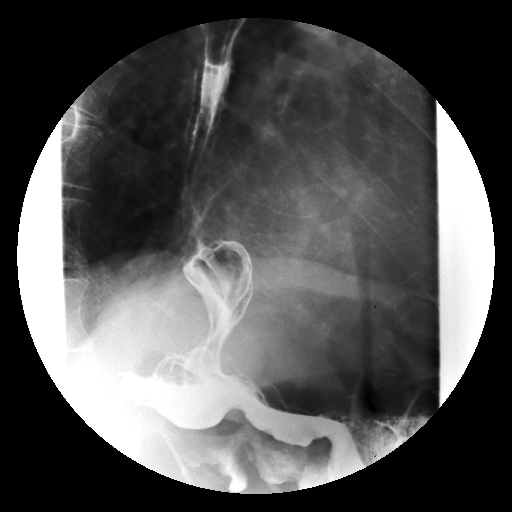

[Series 6: run · 1 of 1 slices shown (5 of 19)]
[im 1/1]
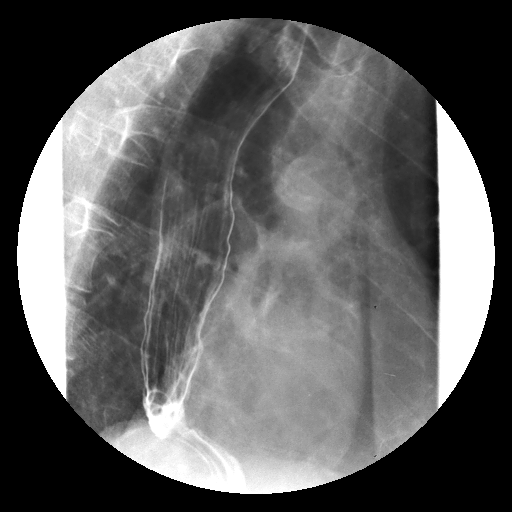

[Series 7: run · 1 of 1 slices shown (6 of 19)]
[im 1/1]
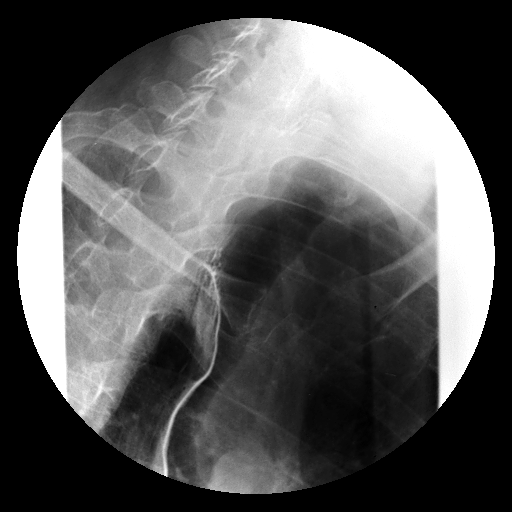

[Series 9: run · 1 of 1 slices shown (7 of 19)]
[im 1/1]
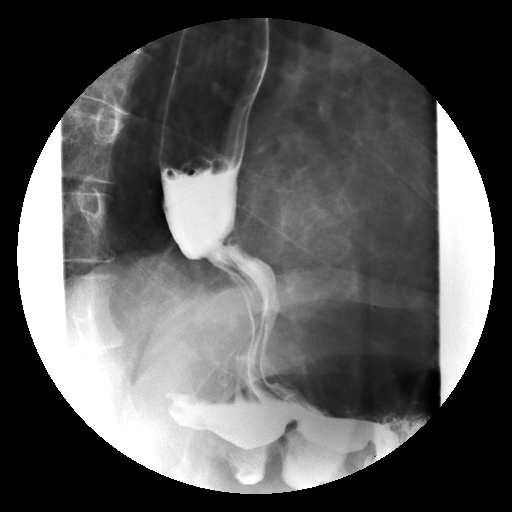

[Series 10: run · 1 of 1 slices shown (8 of 19)]
[im 1/1]
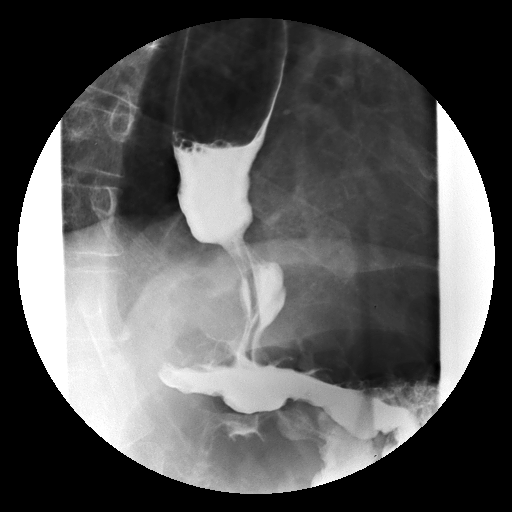

[Series 11: run · 1 of 1 slices shown (9 of 19)]
[im 1/1]
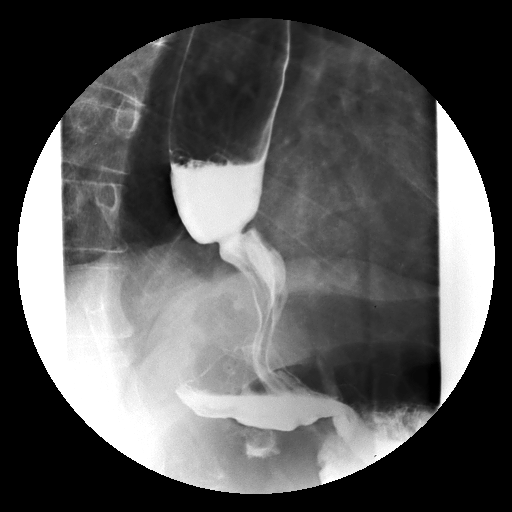

[Series 13: run · 1 of 1 slices shown (10 of 19)]
[im 1/1]
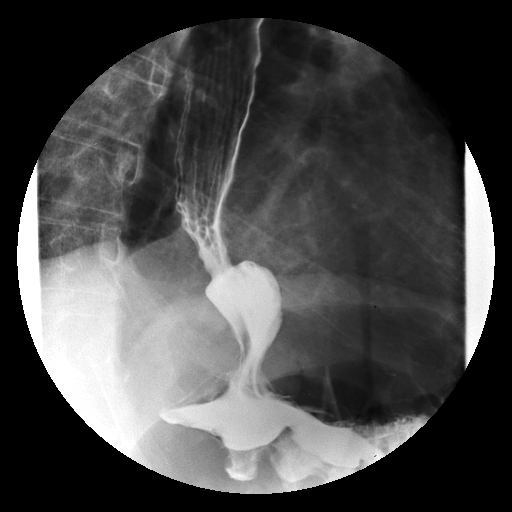

[Series 14: run · 1 of 1 slices shown (11 of 19)]
[im 1/1]
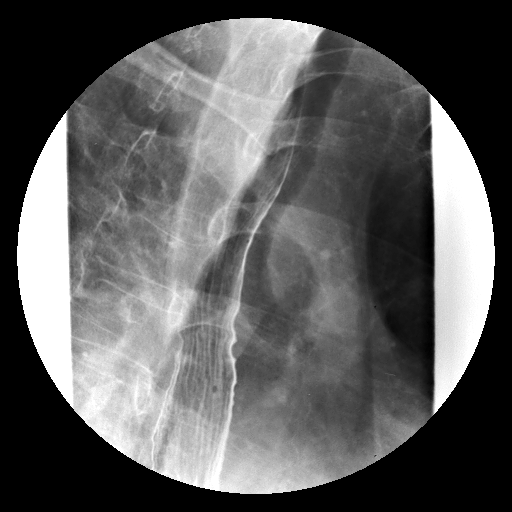

[Series 15: run · 1 of 1 slices shown (12 of 19)]
[im 1/1]
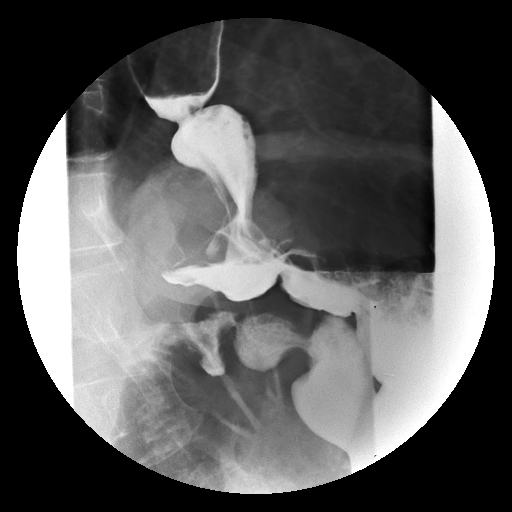

[Series 16: run · 1 of 1 slices shown (13 of 19)]
[im 1/1]
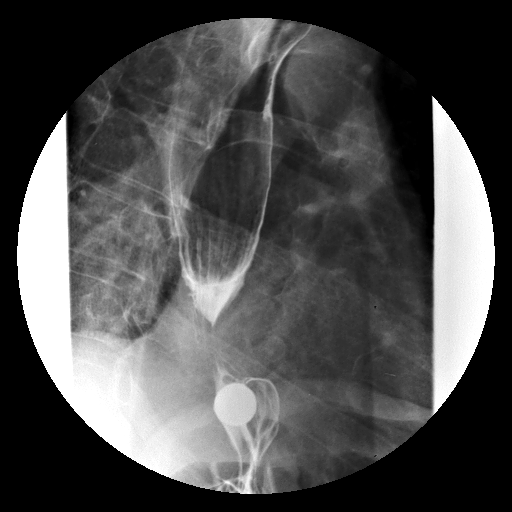

[Series 18: run · 1 of 1 slices shown (14 of 19)]
[im 1/1]
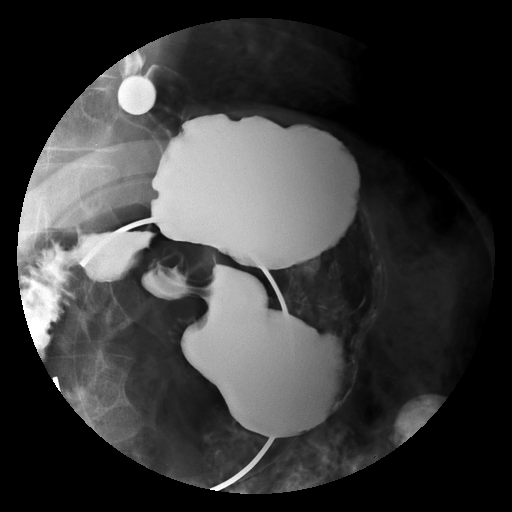

[Series 19: run · 1 of 1 slices shown (15 of 19)]
[im 1/1]
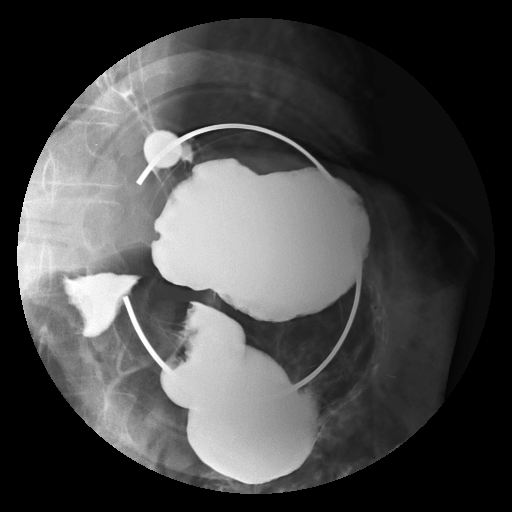

[Series 20: run · 1 of 1 slices shown (16 of 19)]
[im 1/1]
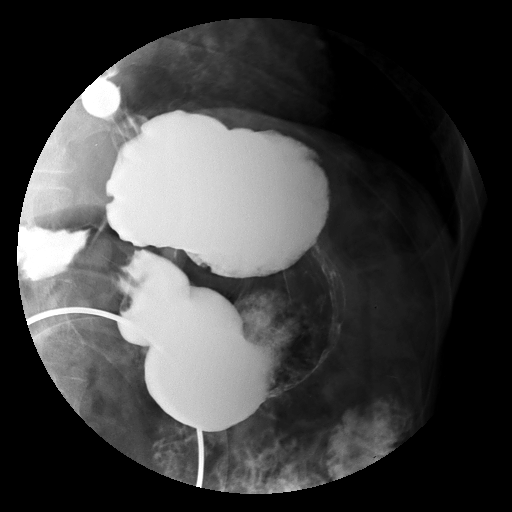

[Series 21: run · 1 of 1 slices shown (17 of 19)]
[im 1/1]
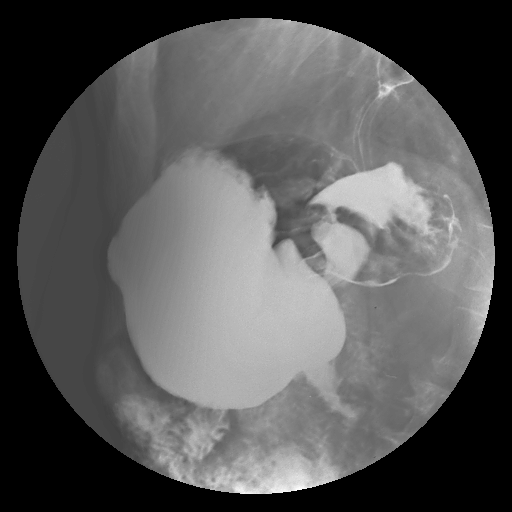

[Series 23: run · 1 of 1 slices shown (18 of 19)]
[im 1/1]
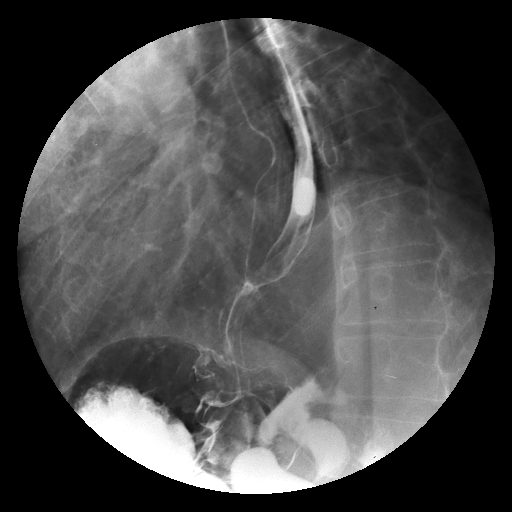

[Series 24: run · 1 of 1 slices shown (19 of 19)]
[im 1/1]
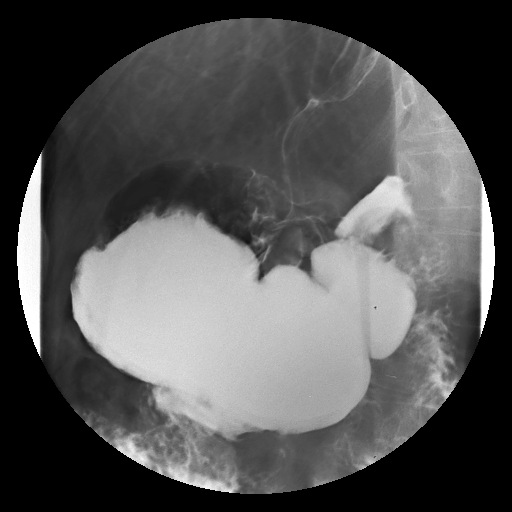

[19 of 24 positions shown; findings below may reference images not displayed]

FINDINGS: The scout film of the abdomen demonstrates an
unremarkable bowel gas pattern.
Surgical material overlying the right pelvis is identified.

The pharynx is unremarkable.

A moderate stricture at the esophagogastric junction is noted and
obstructs the barium tablet.
No other areas of fixed narrowing are identified.
No mucosal abnormalities or fixed filling defects are noted.

A small hiatal hernia is noted - the stomach is otherwise
unremarkable.
The proximal duodenum is within normal limits.

A small amount of gastroesophageal reflux was noted during the
study.
IMPRESSION: Moderate distal esophageal stricture obstructing the barium tablet.

Small hiatal hernia and gastroesophageal reflux.

## 2012-05-22 ENCOUNTER — Telehealth: Payer: Self-pay | Admitting: Gastroenterology

## 2012-05-23 NOTE — Telephone Encounter (Signed)
Dr Christella Hartigan can we set the pt up for EGD Dil [facility] for stricture or does he need to be seen first?

## 2012-05-24 NOTE — Telephone Encounter (Signed)
Ok, looks like i have time tomorrow on a MAC day at [facility],  Can we put him in for MAC, balloon dilation, EGD for esophageal stricture.  Let his niece, Joyce Gross, know about it. She is very helpful.  Make sure she knows it is not going to be at [facility] which is where he's always been done in past.  Thanks

## 2012-05-24 NOTE — Telephone Encounter (Signed)
I spoke with the pt's niece Victorino Dike and she will be available tomorrow to consent for the procedure and the group home Roxanne and kay have been notified.  Instructions have been faxed to [phone]

## 2012-05-25 ENCOUNTER — Encounter: Payer: Self-pay | Admitting: Gastroenterology

## 2012-05-25 ENCOUNTER — Ambulatory Visit (AMBULATORY_SURGERY_CENTER): Payer: Medicare HMO | Admitting: Gastroenterology

## 2012-05-25 VITALS — BP 99/63 | HR 65 | Temp 97.4°F | Resp 15 | Ht <= 58 in | Wt 134.0 lb

## 2012-05-25 DIAGNOSIS — K222 Esophageal obstruction: Secondary | ICD-10-CM

## 2012-05-25 DIAGNOSIS — R1310 Dysphagia, unspecified: Secondary | ICD-10-CM

## 2012-05-25 MED ORDER — SODIUM CHLORIDE 0.9 % IV SOLN: 500.0000 mL | INTRAVENOUS | Status: DC

## 2012-05-25 NOTE — Progress Notes (Signed)
Pt's niece is here with him today, Jeremy Murillo.  She was present while questions were asked. Maw

## 2012-05-25 NOTE — Progress Notes (Addendum)
Called to room to assist during endoscopic procedure.  Patient ID and intended procedure confirmed with present staff. Received instructions for my participation in the procedure from the performing physician. ewm  Pt tolerated dialation well. No distress. ewm

## 2012-05-25 NOTE — Patient Instructions (Addendum)
YOU HAD AN ENDOSCOPIC PROCEDURE TODAY AT THE [facility]: Refer to the procedure report that was given to you for any specific questions about what was found during the examination.  If the procedure report does not answer your questions, please call your gastroenterologist to clarify.  If you requested that your care partner not be given the details of your procedure findings, then the procedure report has been included in a sealed envelope for you to review at your convenience later.  YOU SHOULD EXPECT: Some feelings of bloating in the abdomen. Passage of more gas than usual.  Walking can help get rid of the air that was put into your GI tract during the procedure and reduce the bloating. If you had a lower endoscopy (such as a colonoscopy or flexible sigmoidoscopy) you may notice spotting of blood in your stool or on the toilet paper. If you underwent a bowel prep for your procedure, then you may not have a normal bowel movement for a few days.  DIET:   HAVE CLEAR LIQUIDS AFTER ONE HOUR, THEN HAVE A SOFT DIET FOR THE REST OF THE DAY; PATIENT DILATED AND COULD HAVE SWELLING OF THE ESOPHAGUS. ACTIVITY: Your care partner should take you home directly after the procedure.  You should plan to take it easy, moving slowly for the rest of the day.  You can resume normal activity the day after the procedure however you should NOT DRIVE or use heavy machinery for 24 hours (because of the sedation medicines used during the test).    SYMPTOMS TO REPORT IMMEDIATELY: A gastroenterologist can be reached at any hour.  During normal business hours, 8:30 AM to 5:00 PM Monday through Friday, call [phone].  After hours and on weekends, please call the GI answering service at [phone] who will take a message and have the physician on call contact you.   Following upper endoscopy (EGD)  Vomiting of blood or coffee ground material  New chest pain or pain under the shoulder blades  Painful or  persistently difficult swallowing  New shortness of breath  Fever of 100F or higher  Black, tarry-looking stools  FOLLOW UP: If any biopsies were taken you will be contacted by phone or by letter within the next 1-3 weeks.  Call your gastroenterologist if you have not heard about the biopsies in 3 weeks.  Our staff will call the home number listed on your records the next business day following your procedure to check on you and address any questions or concerns that you may have at that time regarding the information given to you following your procedure. This is a courtesy call and so if there is no answer at the home number and we have not heard from you through the emergency physician on call, we will assume that you have returned to your regular daily activities without incident.  SIGNATURES/CONFIDENTIALITY: You and/or your care partner have signed paperwork which will be entered into your electronic medical record.  These signatures attest to the fact that that the information above on your After Visit Summary has been reviewed and is understood.  Full responsibility of the confidentiality of this discharge information lies with you and/or your care-partner.

## 2012-05-25 NOTE — Progress Notes (Signed)
Patient did not have preoperative order for IV antibiotic SSI prophylaxis. [id number])  YOU HAD AN ENDOSCOPIC PROCEDURE TODAY AT THE [facility]: Refer to the procedure report that was given to you for any specific questions about what was found during the examination.  If the procedure report does not answer your questions, please call your gastroenterologist to clarify.  If you requested that your care partner not be given the details of your procedure findings, then the procedure report has been included in a sealed envelope for you to review at your convenience later.  YOU SHOULD EXPECT: Some feelings of bloating in the abdomen. Passage of more gas than usual.  Walking can help get rid of the air that was put into your GI tract during the procedure and reduce the bloating. If you had a lower endoscopy (such as a colonoscopy or flexible sigmoidoscopy) you may notice spotting of blood in your stool or on the toilet paper. If you underwent a bowel prep for your procedure, then you may not have a normal bowel movement for a few days.  DIET: Your first meal following the procedure should be a light meal and then it is ok to progress to your normal diet.  A half-sandwich or bowl of soup is an example of a good first meal.  Heavy or fried foods are harder to digest and may make you feel nauseous or bloated.  Likewise meals heavy in dairy and vegetables can cause extra gas to form and this can also increase the bloating.  Drink plenty of fluids but you should avoid alcoholic beverages for 24 hours.  ACTIVITY: Your care partner should take you home directly after the procedure.  You should plan to take it easy, moving slowly for the rest of the day.  You can resume normal activity the day after the procedure however you should NOT DRIVE or use heavy machinery for 24 hours (because of the sedation medicines used during the test).    SYMPTOMS TO REPORT IMMEDIATELY: A gastroenterologist can be reached at  any hour.  During normal business hours, 8:30 AM to 5:00 PM Monday through Friday, call [phone].  After hours and on weekends, please call the GI answering service at [phone] who will take a message and have the physician on call contact you.   Following lower endoscopy (colonoscopy or flexible sigmoidoscopy):  Excessive amounts of blood in the stool  Significant tenderness or worsening of abdominal pains  Swelling of the abdomen that is new, acute  Fever of 100F or higher  Following upper endoscopy (EGD)  Vomiting of blood or coffee ground material  New chest pain or pain under the shoulder blades  Painful or persistently difficult swallowing  New shortness of breath  Fever of 100F or higher  Black, tarry-looking stools  FOLLOW UP: If any biopsies were taken you will be contacted by phone or by letter within the next 1-3 weeks.  Call your gastroenterologist if you have not heard about the biopsies in 3 weeks.  Our staff will call the home number listed on your records the next business day following your procedure to check on you and address any questions or concerns that you may have at that time regarding the information given to you following your procedure. This is a courtesy call and so if there is no answer at the home number and we have not heard from you through the emergency physician on call, we will assume that you have returned  to your regular daily activities without incident.  SIGNATURES/CONFIDENTIALITY: You and/or your care partner have signed paperwork which will be entered into your electronic medical record.  These signatures attest to the fact that that the information above on your After Visit Summary has been reviewed and is understood.  Full responsibility of the confidentiality of this discharge information lies with you and/or your care-partner.

## 2012-05-25 NOTE — Progress Notes (Signed)
Spoke with Bevelyn Ngo, 1st shift worker at [employer] about medications given.  She said he did take his regular prescribed medications this am and last pm.  He has not used any prn meds recently.  Maw  Weston Brass, RN spoke with the pt's gaurdian, Randale Carvalho on the phone and he gave telephone consent for the egd with possible dilatation by Dr. Christella Hartigan.  I was the 2nd witness to this call. Maw

## 2012-05-25 NOTE — Op Note (Signed)
[facility] [address] [place], [address]   ENDOSCOPY PROCEDURE REPORT  PATIENT: Zan, Orlick  MR#: [id number] BIRTHDATE: 03/13/1950 , 61  yrs. old GENDER: Male ENDOSCOPIST: Rachael Fee, MD PROCEDURE DATE:  05/25/2012 PROCEDURE:  EGD, with balloon dilation ASA CLASS:     Class III INDICATIONS:  chronic benign GE junction stricture; last dilated 12/2010 to 18mm; care staff feels his swallowing has become difficult again. MEDICATIONS: propofol (Diprivan) 100mg  IV and MAC sedation, administered by CRNA TOPICAL ANESTHETIC: none  DESCRIPTION OF PROCEDURE: After the risks benefits and alternatives of the procedure were thoroughly explained, informed consent was obtained.  The LB GIF-H180 [id number] endoscope was introduced through the mouth and advanced to the second portion of the duodenum. Without limitations.  The instrument was slowly withdrawn as the mucosa was fully examined.  There was a smooth, benign appearing narrowing of GE junction, not as tight as it has appeared previously but still somewhat snug (15mm).  This was dilated with 18mm CRE TTS balloon held inflated for 1 minute.  There was the usual minor mucosal tear and self limited oozing of blood following dilation.  There was a 2cm hiatal hernia.  The examination was otherwise normal.  Retroflexed views revealed no abnormalities.     The scope was then withdrawn from the patient and the procedure completed. COMPLICATIONS: There were no complications.  ENDOSCOPIC IMPRESSION: There was a smooth, benign appearing narrowing of GE junction, not as tight as it has appeared previously but still somewhat snug (15mm).  This was dilated with 18mm CRE TTS balloon held inflated for 1 minute.  There was the usual minor mucosal tear and self limited oozing of blood following dilation.  There was a 2cm hiatal hernia.  The examination was otherwise normal.  RECOMMENDATIONS: Repeat dilation as  needed   eSigned:  Rachael Fee, MD 05/25/2012 11:18 AM

## 2012-05-25 NOTE — Progress Notes (Signed)
Stable to RR 

## 2012-05-28 ENCOUNTER — Telehealth: Payer: Self-pay | Admitting: *Deleted

## 2012-05-28 NOTE — Telephone Encounter (Signed)
  Follow up Call-  Call back number 05/25/2012  Post procedure Call Back phone  # [phone] group home - [facility]  Permission to leave phone message Yes     Patient questions:  Unable to reach patient.  Staff from [employer] transferring me to other people. After the third..I gave up.

## 2012-06-12 ENCOUNTER — Ambulatory Visit: Payer: Medicare HMO | Admitting: Gastroenterology

## 2012-10-18 ENCOUNTER — Other Ambulatory Visit: Payer: Self-pay | Admitting: Family Medicine

## 2012-10-18 DIAGNOSIS — R2681 Unsteadiness on feet: Secondary | ICD-10-CM

## 2012-11-12 ENCOUNTER — Encounter: Payer: Self-pay | Admitting: Neurology

## 2012-11-12 ENCOUNTER — Ambulatory Visit (INDEPENDENT_AMBULATORY_CARE_PROVIDER_SITE_OTHER): Payer: Medicare HMO | Admitting: Neurology

## 2012-11-12 VITALS — BP 113/72 | HR 85 | Ht <= 58 in | Wt 127.0 lb

## 2012-11-12 DIAGNOSIS — E039 Hypothyroidism, unspecified: Secondary | ICD-10-CM

## 2012-11-12 DIAGNOSIS — K649 Unspecified hemorrhoids: Secondary | ICD-10-CM

## 2012-11-12 DIAGNOSIS — K219 Gastro-esophageal reflux disease without esophagitis: Secondary | ICD-10-CM

## 2012-11-12 DIAGNOSIS — Q909 Down syndrome, unspecified: Secondary | ICD-10-CM

## 2012-11-12 NOTE — Progress Notes (Signed)
[facility]  PATIENT: Jeremy Murillo DOB: 11-Dec-1950  HISTORICAL  Jeremy Murillo is a 62 years old right-handed Caucasian male, referred by his primary care physician for evaluation of declining functional status, he is accompanied by his group home caregiver Jeremy Murillo at today's clinical visit. He lives at [facility] since March 2012.  He has past medical history of esophageal constriction, Gerd, hemorrhoid, esophagitis, Down's syndrome.  When he first reached current group home, he was able to dress himself, using bathroom, eating without any difficulty, he needs some help in bathing, but over the past 6 months, he was noticed to have a steady decline in functional status, he needs to be reminded many times to finish his dishes, he also has trouble controlling his bowel and bladder.  He denies significant gait difficulty, sleeping well,      REVIEW OF SYSTEMS: Full 14 system review of systems performed and notable only for incontinence.  ALLERGIES: No Known Allergies  HOME MEDICATIONS: Outpatient Prescriptions Prior to Visit  Medication Sig Dispense Refill  . acetaminophen (TYLENOL) 325 MG tablet Take 325 mg by mouth every 6 (six) hours as needed.       . bismuth subsalicylate (PEPTO BISMOL) 262 MG/15ML suspension Take 20 mLs by mouth every 4 (four) hours as needed.       . cholecalciferol (VITAMIN D) 1000 UNITS tablet Take 1,000 Units by mouth daily.        Marland Kitchen guaiFENesin (ROBITUSSIN) 100 MG/5ML liquid Take 200 mg by mouth 3 (three) times daily as needed.        . hydrocortisone (ANUSOL-HC) 25 MG suppository Place 25 mg rectally 3 (three) times daily.      Marland Kitchen levothyroxine (SYNTHROID, LEVOTHROID) 112 MCG tablet Take 88 mcg by mouth daily.       Marland Kitchen nystatin cream (MYCOSTATIN) Apply topically 2 (two) times daily.      Marland Kitchen omeprazole (PRILOSEC) 20 MG capsule Take 20 mg by mouth 2 (two) times daily.       . polyethylene glycol (MIRALAX / GLYCOLAX)  packet Take 17 g by mouth daily.        . simvastatin (ZOCOR) 5 MG tablet Take 10 mg by mouth at bedtime.       . Skin Protectants, Misc. (EUCERIN) cream Apply topically as needed.        . bacitracin (BACTERICIN) 500 UNIT/GM ointment Apply 1 application topically 2 (two) times daily.        Marland Kitchen triamcinolone cream (KENALOG) 0.1 % Apply topically 2 (two) times daily.         PAST MEDICAL HISTORY: Past Medical History  Diagnosis Date  . Down's syndrome   . Unspecified hemorrhoids without mention of complication   . Stricture and stenosis of esophagus   . Esophageal reflux   . Barrett's esophagus   . Dysphagia, unspecified(787.20)   . Diaphragmatic hernia without mention of obstruction or gangrene   . Unspecified hypothyroidism     PAST SURGICAL HISTORY: History reviewed. No pertinent past surgical history.  FAMILY HISTORY: Family History  Problem Relation Age of Onset  . Colon cancer Neg Hx   . Esophageal cancer Neg Hx   . Rectal cancer Neg Hx   . Stomach cancer Neg Hx     SOCIAL HISTORY:  History   Social History  . Marital Status: Single    Spouse Name: N/A    Number of Children: 0  . Years of Education: N/A  Occupational History  . Disabled    Social History Main Topics  . Smoking status: Never Smoker   . Smokeless tobacco: Never Used  . Alcohol Use: No  . Drug Use: No  . Sexual Activity: Not on file    Social History Narrative   Patient lives in a group [facility].    Caffeine- very rare.   Right handed.   PHYSICAL EXAM   Filed Vitals:   11/12/12 0856  BP: 113/72  Pulse: 85  Height: 4\' 9"  (1.448 m)  Weight: 127 lb (57.607 kg)   Body mass index is 27.48 kg/(m^2).   Generalized: In no acute distress  Neck: Supple, no carotid bruits   Cardiac: Regular rate rhythm  Pulmonary: Clear to auscultation bilaterally  Musculoskeletal: No deformity  Neurological examination  Mentation: Awake, following one step command only.  Cranial  nerve II-XII: Pupils were equal round reactive to light extraocular movements were full, visual field were full on confrontational test. facial sensation and strength were normal. hearing was intact to finger rubbing bilaterally. Uvula tongue midline.  head turning and shoulder shrug and were normal and symmetric.Tongue protrusion into cheek strength was normal.  Motor: normal tone, bulk and strength.  Sensory: Intact to fine touch, pinprick, preserved vibratory sensation, and proprioception at toes.  Coordination: Normal finger to nose, heel-to-shin bilaterally there was no truncal ataxia  Gait: mild cautious gait.   Romberg signs: Negative  Deep tendon reflexes: Brachioradialis 2/2, biceps 2/2, triceps 2/2, patellar 2/2, Achilles trace, plantar responses were flexor bilaterally.   DIAGNOSTIC DATA (LABS, IMAGING, TESTING) - I reviewed patient records, labs, notes, testing and imaging myself where available.  Lab Results  Component Value Date   WBC 5.5 12/22/2009   HGB 14.9 12/22/2009   HCT 43.7 12/22/2009   MCV 94.1 12/22/2009   PLT 218 12/22/2009      Component Value Date/Time   NA 139 12/22/2009 1125   K 5.6* 12/22/2009 1125   CL 103 12/22/2009 1125   CO2 30 12/22/2009 1125   GLUCOSE 102* 12/22/2009 1125   BUN 15 12/22/2009 1125   CREATININE 1.02 12/22/2009 1125   CALCIUM 9.1 12/22/2009 1125   PROT 7.5 12/22/2009 1125   ALBUMIN 3.6 12/22/2009 1125   AST 60* 12/22/2009 1125   ALT 26 12/22/2009 1125   ALKPHOS 60 12/22/2009 1125   BILITOT 1.3* 12/22/2009 1125   GFRNONAA >60 12/22/2009 1125   GFRAA  Value: >60        The eGFR has been calculated using the MDRD equation. This calculation has not been validated in all clinical situations. eGFR's persistently <60 mL/min signify possible Chronic Kidney Disease. 12/22/2009 1125    ASSESSMENT AND PLAN  62 years old right-handed Caucasian male, with past medical history of Down syndrome, now presenting with worsening functional status, most  consistent with early dementia in patient with Down syndrome.  1 I will complete evaluation with CAT scan of the brain. 2 laboratory evaluation including CBC, CMP, TSH, B12, 3 call him report, 4. Return to clinic in 6 months with Eber Jones.        Levert Feinstein, M.D. Ph.D.  [facility] [address] [place], [place] [address] [phone]

## 2012-11-13 LAB — CBC
MCH: 31.7 pg (ref 26.6–33.0)
Platelets: 210 10*3/uL (ref 150–379)
RBC: 4.92 x10E6/uL (ref 4.14–5.80)
RDW: 15 % (ref 12.3–15.4)
WBC: 5.3 10*3/uL (ref 3.4–10.8)

## 2012-11-13 LAB — FOLATE: Folate: 16.3 ng/mL (ref >3.0)

## 2012-11-13 LAB — THYROID PANEL WITH TSH
T3 Uptake Ratio: 28 % (ref 24–39)
T4, Total: 9.7 ug/dL (ref 4.5–12.0)

## 2012-11-13 LAB — COMPREHENSIVE METABOLIC PANEL
ALT: 15 IU/L (ref 0–44)
Albumin/Globulin Ratio: 1.3 (ref 1.1–2.5)
CO2: 27 mmol/L (ref 18–29)
Calcium: 9.2 mg/dL (ref 8.6–10.2)
GFR calc non Af Amer: 75 mL/min/{1.73_m2} (ref >59)
Glucose: 71 mg/dL (ref 65–99)
Potassium: 4.5 mmol/L (ref 3.5–5.2)
Total Protein: 7.2 g/dL (ref 6.0–8.5)

## 2012-11-13 NOTE — Progress Notes (Signed)
Quick Note:  Please call pt for normal lab. ______

## 2012-11-15 ENCOUNTER — Telehealth: Payer: Self-pay

## 2012-11-15 NOTE — Telephone Encounter (Signed)
Message copied by [facility] on Thu Nov 15, 2012  2:40 PM ------      Message from: Levert Feinstein      Created: Tue Nov 13, 2012  4:41 PM       Please call pt for normal lab. ------

## 2012-11-15 NOTE — Telephone Encounter (Signed)
I called patient and spoke with Jeremy Murillo, emergency contact. I provided him with lab results as normal. He said, "Okay. Excellent. Thank you". He had no questions.

## 2012-12-10 ENCOUNTER — Ambulatory Visit
Admission: RE | Admit: 2012-12-10 | Discharge: 2012-12-10 | Disposition: A | Discharge status: Home / Self Care | Payer: Medicare HMO | Source: Ambulatory Visit | Attending: Neurology | Admitting: Neurology

## 2012-12-10 DIAGNOSIS — K219 Gastro-esophageal reflux disease without esophagitis: Secondary | ICD-10-CM

## 2012-12-10 DIAGNOSIS — Q909 Down syndrome, unspecified: Secondary | ICD-10-CM

## 2012-12-10 DIAGNOSIS — E039 Hypothyroidism, unspecified: Secondary | ICD-10-CM

## 2012-12-10 DIAGNOSIS — K649 Unspecified hemorrhoids: Secondary | ICD-10-CM

## 2012-12-10 DIAGNOSIS — R413 Other amnesia: Secondary | ICD-10-CM

## 2012-12-12 NOTE — Progress Notes (Signed)
Quick Note:  Please call patient, CT scan showed age related change, no acute lesions. ______

## 2012-12-20 NOTE — Progress Notes (Signed)
Quick Note:  I called and spoke to [facility] at pts residence (group home?), and relayed the CT results. Mailed copy to them also. ______

## 2013-05-14 ENCOUNTER — Encounter: Payer: Self-pay | Admitting: Nurse Practitioner

## 2013-05-14 ENCOUNTER — Encounter (INDEPENDENT_AMBULATORY_CARE_PROVIDER_SITE_OTHER): Payer: Self-pay

## 2013-05-14 ENCOUNTER — Ambulatory Visit (INDEPENDENT_AMBULATORY_CARE_PROVIDER_SITE_OTHER): Payer: Medicare HMO | Admitting: Nurse Practitioner

## 2013-05-14 VITALS — BP 100/66 | HR 74 | Ht 58.5 in | Wt 130.0 lb

## 2013-05-14 DIAGNOSIS — R413 Other amnesia: Secondary | ICD-10-CM

## 2013-05-14 DIAGNOSIS — Q909 Down syndrome, unspecified: Secondary | ICD-10-CM

## 2013-05-14 MED ORDER — MEMANTINE HCL ER 28 MG PO CP24: 28.0000 mg | ORAL_CAPSULE | Freq: Every day | ORAL | Status: DC

## 2013-05-14 MED ORDER — MEMANTINE HCL ER 7 & 14 & 21 &28 MG PO CP24: ORAL_CAPSULE | ORAL | Status: DC

## 2013-05-14 NOTE — Patient Instructions (Signed)
Per  Group home instructions

## 2013-05-14 NOTE — Progress Notes (Signed)
[facility]  PATIENT: Jeremy Murillo DOB: 1950/08/30   REASON FOR VISIT: Followup for memory loss, functional decline   HISTORY OF PRESENT ILLNESS:Mr. Revelle, 63 year old male returns for followup. He was initially evaluated 11/12/2012 by Dr. Terrace Arabia for declining functional status. He lives in a group home. He requires assistance with all activities of daily living except he can feed himself, needs  constant reminders to eat. Much difficulty with following commands. He has not fallen. Labs for dementia all returned normal. CT of the head with generalized atrophy and small vessel disease. He returns for reevaluation.   HISTORY: evaluation of declining functional status, he is accompanied by his group home caregiver Joyce Gross at today's clinical visit. He lives at [facility] at [facility] since March 2012.  He has past medical history of esophageal constriction, Gerd, hemorrhoid, esophagitis, Down's syndrome.  When he first reached current group home, he was able to dress himself, using bathroom, eating without any difficulty, he needs some help in bathing, but over the past 6 months, he was noticed to have a steady decline in functional status, he needs to be reminded many times to finish his dishes, he also has trouble controlling his bowel and bladder.  He denies significant gait difficulty, sleeping well,      REVIEW OF SYSTEMS: Full 14 system review of systems performed and notable only for those listed, all others are neg:  Constitutional: N/A  Cardiovascular: N/A  Ear/Nose/Throat: N/A  Skin: N/A  Eyes: N/A  Respiratory: N/A  Gastroitestinal: N/A  Hematology/Lymphatic: N/A  Endocrine: N/A Musculoskeletal:N/A  Allergy/Immunology: N/A  Neurological: Memory loss  Psychiatric: N/A   ALLERGIES: No Known Allergies  HOME MEDICATIONS: Outpatient Prescriptions Prior to Visit  Medication Sig Dispense Refill  . bismuth subsalicylate (PEPTO BISMOL) 262  MG/15ML suspension Take 20 mLs by mouth every 4 (four) hours as needed.       . cholecalciferol (VITAMIN D) 1000 UNITS tablet Take 1,000 Units by mouth daily.        . coal tar (NEUTROGENA T-GEL) 0.5 % shampoo Apply topically. Three times per week      . guaiFENesin (ROBITUSSIN) 100 MG/5ML liquid Take 200 mg by mouth 3 (three) times daily as needed.        . hydrocortisone (ANUSOL-HC) 25 MG suppository Place 25 mg rectally 3 (three) times daily.      Marland Kitchen levothyroxine (SYNTHROID, LEVOTHROID) 112 MCG tablet Take 88 mcg by mouth daily.       Marland Kitchen nystatin cream (MYCOSTATIN) Apply topically 2 (two) times daily.      Marland Kitchen omeprazole (PRILOSEC) 20 MG capsule Take 20 mg by mouth 2 (two) times daily.       . polyethylene glycol (MIRALAX / GLYCOLAX) packet Take 17 g by mouth daily.        . simvastatin (ZOCOR) 5 MG tablet Take 10 mg by mouth at bedtime.       . Skin Protectants, Misc. (EUCERIN) cream Apply topically as needed.        Marland Kitchen acetaminophen (TYLENOL) 325 MG tablet Take 325 mg by mouth every 6 (six) hours as needed.        No facility-administered medications prior to visit.    PAST MEDICAL HISTORY: Past Medical History  Diagnosis Date  . Down's syndrome   . Unspecified hemorrhoids without mention of complication   . Stricture and stenosis of esophagus   . Esophageal reflux   . Barrett's esophagus   . Dysphagia, unspecified(787.20)   .  Diaphragmatic hernia without mention of obstruction or gangrene   . Unspecified hypothyroidism   . Dementia     PAST SURGICAL HISTORY: History reviewed. No pertinent past surgical history.  FAMILY HISTORY: Family History  Problem Relation Age of Onset  . Colon cancer Neg Hx   . Esophageal cancer Neg Hx   . Rectal cancer Neg Hx   . Stomach cancer Neg Hx     SOCIAL HISTORY: History   Social History  . Marital Status: Single    Spouse Name: N/A    Number of Children: 0  . Years of Education: N/A   Occupational History  . Disabled    Social  History Main Topics  . Smoking status: Never Smoker   . Smokeless tobacco: Never Used  . Alcohol Use: No  . Drug Use: No  . Sexual Activity: Not on file   Other Topics Concern  . Not on file   Social History Narrative   Patient lives in a group  Home.    Caffeine- very rare.   Right handed.     PHYSICAL EXAM  Filed Vitals:   05/14/13 0921  BP: 100/66  Pulse: 74  Height: 4' 10.5" (1.486 m)  Weight: 130 lb (58.968 kg)   Body mass index is 26.7 kg/(m^2).  Generalized: Well developed, in no acute distress  Head: normocephalic and atraumatic,. Oropharynx benign  Neck: Supple, no carotid bruits  Cardiac: Regular rate rhythm, no murmur  Musculoskeletal: No deformity   Neurological examination   Mentation: Awake follows one-step commands, little speech  Cranial nerve II-XII: Pupils were equal round reactive to light extraocular movements were full, visual field were full on confrontational test. Facial sensation and strength were normal. hearing was intact to finger rubbing bilaterally. Uvula tongue midline. head turning and shoulder shrug were normal and symmetric.Tongue protrusion into cheek strength was normal. Motor: normal bulk and tone, full strength in the BUE, BLE,  No focal weakness Coordination: Unable to perform  Reflexes: Brachioradialis 2/2, biceps 2/2, triceps 2/2, patellar 2/2, Achilles trace plantar responses were flexor bilaterally. Gait and Station: Rising up from seated position without assistance, cautious gait, no assistive device DIAGNOSTIC DATA (LABS, IMAGING, TESTING) - I reviewed patient records, labs, notes, testing and imaging myself where available.  Lab Results  Component Value Date   WBC 5.3 11/12/2012   HGB 15.6 11/12/2012   HCT 47.4 11/12/2012   MCV 96 11/12/2012   PLT 210 11/12/2012      Component Value Date/Time   NA 140 11/12/2012 1006   NA 139 12/22/2009 1125   K 4.5 11/12/2012 1006   CL 97 11/12/2012 1006   CO2 27 11/12/2012 1006    GLUCOSE 71 11/12/2012 1006   GLUCOSE 102* 12/22/2009 1125   BUN 18 11/12/2012 1006   BUN 15 12/22/2009 1125   CREATININE 1.07 11/12/2012 1006   CALCIUM 9.2 11/12/2012 1006   PROT 7.2 11/12/2012 1006   PROT 7.5 12/22/2009 1125   ALBUMIN 3.6 12/22/2009 1125   AST 24 11/12/2012 1006   ALT 15 11/12/2012 1006   ALKPHOS 94 11/12/2012 1006   BILITOT 0.3 11/12/2012 1006   GFRNONAA 75 11/12/2012 1006   GFRAA 86 11/12/2012 1006    Lab Results  Component Value Date   VITAMINB12 354 11/12/2012   Lab Results  Component Value Date   TSH 1.270 11/12/2012      ASSESSMENT AND PLAN  63 y.o. year old male  has a past medical history of Down's syndrome;  Stricture and stenosis of esophagus; Esophageal reflux; Barrett's esophagus; Dysphagia, unspecified(787.20);  Unspecified hypothyroidism; and Dementia. here to followup for dementia. CT of the head with generalized atrophy and small vessel disease.  Will begin Namenda XR starter pack RX to cg then  Switch to Namenda 28 mg XR daily after starter pack concluded RX to cg Followup in 6 months Nilda Riggs, [facility], APRN  [facility] [address] [place], [place] [address] [phone]

## 2013-05-27 ENCOUNTER — Telehealth: Payer: Self-pay | Admitting: Neurology

## 2013-05-27 NOTE — Telephone Encounter (Signed)
Pt's nurse Roxanne (RN) from the [facility] calling stating that the pt was given the wrong dose of Namenda. Pt is taking the titration of Namenda and pt was supposed to be taking 14 mg for 1 week, instead was given 21 mg for 5 days and nurse stated that the pt got the 14 mg today. Nurse would like to know what should they do at this point. I explained to the nurse that Dr. Terrace ArabiaYan is out of the office and [facility] Dr[facility] Hosie PoissonSumner. Please advise

## 2013-05-27 NOTE — Telephone Encounter (Signed)
Called pt's nurse Roxanne, RN to inform her per Dr. Hosie PoissonSumner that the pt was fine and to take the 14 mg this week as instructed and then increase to the higher dose. If the pt is unable to tolerate the jump to the higher dose then please give us a call back. Roxanne, RN verbalized understanding.

## 2013-05-27 NOTE — Telephone Encounter (Signed)
Please let her know that is fine. She should just take the 14mg  this week as instructed and then increase to the higher dose. If she is unable to tolerate the jump to the higher dose then please give us a call back. Thanks.

## 2013-06-04 ENCOUNTER — Ambulatory Visit (INDEPENDENT_AMBULATORY_CARE_PROVIDER_SITE_OTHER): Payer: Commercial Managed Care - HMO | Admitting: Gastroenterology

## 2013-06-04 ENCOUNTER — Encounter: Payer: Self-pay | Admitting: Gastroenterology

## 2013-06-04 VITALS — BP 100/68 | HR 68 | Ht <= 58 in | Wt 129.0 lb

## 2013-06-04 DIAGNOSIS — R1314 Dysphagia, pharyngoesophageal phase: Secondary | ICD-10-CM

## 2013-06-04 NOTE — Patient Instructions (Signed)
If helpers, staff feel that Jeremy Murillo is having trouble swallowing again please call here to scheduled repeat EGD with dilation.

## 2013-06-04 NOTE — Progress Notes (Signed)
Review of pertinent gastrointestinal problems:  1. 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 2011 required 3 dilations. 11/12 EGD Ayame Rena, dilated to 18mm.  4/14 EGD Kaidin Boehle, dilated to 18mm.   HPI: This is a  very pleasant 63 year old man with Down's syndrome who I have known for several years now. 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 He is here with staff from his healthcare facility. He is unable to give any reliable history. He is usually with his niece but she is not here today.   Has been doing "fine right now."  A couple weeks ago he had two days of "throat problems"  But no other issues that staff knows of. Stable weight. No vomiting.  No overt bleeding.  Recently started on dementia medicines.  Was forgetting how to eat, forgetting to pull down pants while sitting on toilette.   Past Medical History  Diagnosis Date  . Down's syndrome   . Unspecified hemorrhoids without mention of complication   . Stricture and stenosis of esophagus   . Esophageal reflux   . Barrett's esophagus   . Dysphagia, unspecified(787.20)   . Diaphragmatic hernia without mention of obstruction or gangrene   . Unspecified hypothyroidism   . Dementia     History reviewed. No pertinent past surgical history.  Current Outpatient Prescriptions  Medication Sig Dispense Refill  . bismuth subsalicylate (PEPTO BISMOL) 262 MG/15ML suspension Take 20 mLs by mouth every 4 (four) hours as needed.       . cholecalciferol (VITAMIN D) 1000 UNITS tablet Take 1,000 Units by mouth daily.        . coal tar (NEUTROGENA T-GEL) 0.5 % shampoo Apply topically. Three times per week      . guaiFENesin (ROBITUSSIN) 100 MG/5ML liquid Take 200 mg by mouth 3 (three) times daily as needed.        . hydrocortisone (ANUSOL-HC) 25 MG suppository Place 25 mg rectally 3 (three) times daily.      Marland Kitchen. levothyroxine (SYNTHROID, LEVOTHROID) 112 MCG tablet Take 88 mcg by mouth daily.        . Memantine HCl ER (NAMENDA XR) 28 MG CP24 Take 28 mg by mouth daily.  30 capsule  5  . nystatin cream (MYCOSTATIN) Apply topically 2 (two) times daily.      Marland Kitchen. omeprazole (PRILOSEC) 20 MG capsule Take 20 mg by mouth 2 (two) times daily.       . polyethylene glycol (MIRALAX / GLYCOLAX) packet Take 17 g by mouth daily.        . simvastatin (ZOCOR) 5 MG tablet Take 10 mg by mouth at bedtime.       . Skin Protectants, Misc. (EUCERIN) cream Apply topically as needed.        . zinc oxide 20 % ointment Apply 1 application topically as needed for irritation.       No current facility-administered medications for this visit.    Allergies as of 06/04/2013  . (No Known Allergies)    Family History  Problem Relation Age of Onset  . Colon cancer Neg Hx   . Esophageal cancer Neg Hx   . Rectal cancer Neg Hx   . Stomach cancer Neg Hx     History   Social History  . Marital Status: Single    Spouse Name: N/A    Number of Children: 0  . Years of Education: N/A   Occupational History  . Disabled    Social History Main Topics  .  Smoking status: Never Smoker   . Smokeless tobacco: Never Used  . Alcohol Use: No  . Drug Use: No  . Sexual Activity: Not on file   Other Topics Concern  . Not on file   Social History Narrative   Patient lives in a group [facility].    Caffeine- very rare.   Right handed.      Physical Exam: BP 100/68  Pulse 68  Ht 4\' 10"  (1.473 m)  Wt 129 lb (58.514 kg)  BMI 26.97 kg/m2 Constitutional: generally well-appearing, Down syndrome Psychiatric: alert and oriented x3 Abdomen: soft, nontender, nondistended, no obvious ascites, no peritoneal signs, normal bowel sounds     Assessment and plan: 63 y.o. 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 They do not feel he is having  trouble with swallowing currently. I recommended they do feel his swallowing issue is becoming a problem again at that he should call and I would repeat EGD with dilation on an as needed basis.

## 2013-11-15 ENCOUNTER — Encounter: Payer: Self-pay | Admitting: Nurse Practitioner

## 2013-11-15 ENCOUNTER — Ambulatory Visit (INDEPENDENT_AMBULATORY_CARE_PROVIDER_SITE_OTHER): Payer: Medicare HMO | Admitting: Nurse Practitioner

## 2013-11-15 ENCOUNTER — Encounter (INDEPENDENT_AMBULATORY_CARE_PROVIDER_SITE_OTHER): Payer: Self-pay

## 2013-11-15 VITALS — BP 105/69 | HR 85 | Ht <= 58 in | Wt 129.2 lb

## 2013-11-15 DIAGNOSIS — Q909 Down syndrome, unspecified: Secondary | ICD-10-CM

## 2013-11-15 DIAGNOSIS — R413 Other amnesia: Secondary | ICD-10-CM

## 2013-11-15 MED ORDER — MEMANTINE HCL ER 28 MG PO CP24: 28.0000 mg | ORAL_CAPSULE | Freq: Every day | ORAL | Status: DC

## 2013-11-15 MED ORDER — DONEPEZIL HCL 5 MG PO TABS: 5.0000 mg | ORAL_TABLET | Freq: Every day | ORAL | Status: DC

## 2013-11-15 NOTE — Patient Instructions (Signed)
Per group home sheet 

## 2013-11-15 NOTE — Progress Notes (Signed)
[facility]  PATIENT: Jeremy Murillo DOB: 1950/09/16  REASON FOR VISIT: Followup for memory loss   HISTORY OF PRESENT ILLNESS:Mr. Ashurst, 63 year old male returns for followup. He was initially evaluated 11/12/2012 by Dr. Terrace Arabia for declining functional status. He was last seen 05/14/2013 He lives in a group home. He requires assistance with all activities of daily living except he can feed himself, needs constant reminders to eat. Much difficulty with following commands. He has not fallen. He wears depends for urinary  incontinence. Labs for dementia all returned normal. CT of the head with generalized atrophy and small vessel disease. He was started on Namenda at his last visit which has helped his behavior. He is also on Aricept. He returns for reevaluation.  HISTORY: evaluation of declining functional status, he is accompanied by his group home caregiver Joyce Gross at today's clinical visit. He lives at [facility] since March 2012.  He has past medical history of esophageal constriction, Gerd, hemorrhoid, esophagitis, Down's syndrome.  When he first reached current group home, he was able to dress himself, using bathroom, eating without any difficulty, he needs some help in bathing, but over the past 6 months, he was noticed to have a steady decline in functional status, he needs to be reminded many times to finish his dishes, he also has trouble controlling his bowel and bladder.  He denies significant gait difficulty, sleeping well,      REVIEW OF SYSTEMS: Full 14 system review of systems performed and notable only for those listed, all others are neg:  Constitutional: N/A  Cardiovascular: N/A  Ear/Nose/Throat: N/A  Skin: N/A  Eyes: N/A  Respiratory: N/A  Gastroitestinal: urinary incontenance Hematology/Lymphatic: N/A  Endocrine: N/A Musculoskeletal:N/A  Allergy/Immunology: N/A  Neurological: N/A Psychiatric: N/A Sleep :  NA   ALLERGIES: No Known Allergies  HOME MEDICATIONS: Outpatient Prescriptions Prior to Visit  Medication Sig Dispense Refill  . cholecalciferol (VITAMIN D) 1000 UNITS tablet Take 1,000 Units by mouth daily.        . coal tar (NEUTROGENA T-GEL) 0.5 % shampoo Apply topically. Three times per week      . hydrocortisone (ANUSOL-HC) 25 MG suppository Place 25 mg rectally 3 (three) times daily.      Marland Kitchen levothyroxine (SYNTHROID, LEVOTHROID) 112 MCG tablet Take 88 mcg by mouth daily.       . Memantine HCl ER (NAMENDA XR) 28 MG CP24 Take 28 mg by mouth daily.  30 capsule  5  . nystatin cream (MYCOSTATIN) Apply topically 2 (two) times daily.      Marland Kitchen omeprazole (PRILOSEC) 20 MG capsule Take 20 mg by mouth 2 (two) times daily.       . simvastatin (ZOCOR) 5 MG tablet Take 10 mg by mouth at bedtime.       . Skin Protectants, Misc. (EUCERIN) cream Apply topically as needed.        . zinc oxide 20 % ointment Apply 1 application topically as needed for irritation.      . bismuth subsalicylate (PEPTO BISMOL) 262 MG/15ML suspension Take 20 mLs by mouth every 4 (four) hours as needed.       Marland Kitchen guaiFENesin (ROBITUSSIN) 100 MG/5ML liquid Take 200 mg by mouth 3 (three) times daily as needed.        . polyethylene glycol (MIRALAX / GLYCOLAX) packet Take 17 g by mouth daily.         No facility-administered medications prior to visit.    PAST MEDICAL  HISTORY: Past Medical History  Diagnosis Date  . Down's syndrome   . Unspecified hemorrhoids without mention of complication   . Stricture and stenosis of esophagus   . Esophageal reflux   . Barrett's esophagus   . Dysphagia, unspecified(787.20)   . Diaphragmatic hernia without mention of obstruction or gangrene   . Unspecified hypothyroidism   . Dementia     PAST SURGICAL HISTORY: Past Surgical History  Procedure Laterality Date  . None      FAMILY HISTORY: Family History  Problem Relation Age of Onset  . Colon cancer Neg Hx   . Esophageal cancer  Neg Hx   . Rectal cancer Neg Hx   . Stomach cancer Neg Hx     SOCIAL HISTORY: History   Social History  . Marital Status: Single    Spouse Name: N/A    Number of Children: 0  . Years of Education: N/A   Occupational History  . Disabled    Social History Main Topics  . Smoking status: Never Smoker   . Smokeless tobacco: Never Used  . Alcohol Use: No  . Drug Use: No  . Sexual Activity: Not on file   Other Topics Concern  . Not on file   Social History Narrative   Patient lives in a group [facility].    Caffeine- very rare.   Right handed.     PHYSICAL EXAM  Filed Vitals:   11/15/13 0857  BP: 105/69  Pulse: 85  Height:  (1.473 m)  Weight: 129 lb 3.2 oz (58.605 kg)   Body mass index is 27.01 kg/(m^2). Generalized: Well developed, in no acute distress  Head: normocephalic and atraumatic,. Oropharynx benign  Neck: Supple, no carotid bruits  Cardiac: Regular rate rhythm, no murmur  Musculoskeletal: No deformity  Neurological examination  Mentation: Awake follows one-step commands, little speech  Cranial nerve II-XII: Pupils were equal round reactive to light extraocular movements were full, visual field were full on confrontational test. Facial sensation and strength were normal. hearing was intact to finger rubbing bilaterally. Uvula tongue midline. head turning and shoulder shrug were normal and symmetric.Tongue protrusion into cheek strength was normal.  Motor: normal bulk and tone, full strength in the BUE, BLE, No focal weakness  Coordination: Unable to perform  Reflexes: Brachioradialis 2/2, biceps 2/2, triceps 2/2, patellar 2/2, Achilles trace plantar responses were flexor bilaterally.  Gait and Station: Rising up from seated position without assistance, cautious gait, no assistive device   DIAGNOSTIC DATA (LABS, IMAGING, TESTING) - I reviewed patient records, labs, notes, testing and imaging myself where available.  Lab Results  Component  Value Date   WBC 5.3 11/12/2012   HGB 15.6 11/12/2012   HCT 47.4 11/12/2012   MCV 96 11/12/2012   PLT 210 11/12/2012      Component Value Date/Time   NA 140 11/12/2012 1006   NA 139 12/22/2009 1125   K 4.5 11/12/2012 1006   CL 97 11/12/2012 1006   CO2 27 11/12/2012 1006   GLUCOSE 71 11/12/2012 1006   GLUCOSE 102* 12/22/2009 1125   BUN 18 11/12/2012 1006   BUN 15 12/22/2009 1125   CREATININE 1.07 11/12/2012 1006   CALCIUM 9.2 11/12/2012 1006   PROT 7.2 11/12/2012 1006   PROT 7.5 12/22/2009 1125   ALBUMIN 3.6 12/22/2009 1125   AST 24 11/12/2012 1006   ALT 15 11/12/2012 1006   ALKPHOS 94 11/12/2012 1006   BILITOT 0.3 11/12/2012 1006   GFRNONAA 75 11/12/2012  1006   GFRAA 86 11/12/2012 1006    Lab Results  Component Value Date   VITAMINB12 354 11/12/2012   Lab Results  Component Value Date   TSH 1.270 11/12/2012      ASSESSMENT AND PLAN  64 y.o. year old male  has a past medical history of Down's syndrome; and Dementia. here to followup.  Continue Namenda  daily Continue Aricept 5 mg daily Will refill for one year Next visit with Dr. Kingsley Spittle Darrol Angel, [facility], [facility], APRN  [facility] [address], [address] [place], [place] [address] [phone]

## 2014-01-02 ENCOUNTER — Encounter: Payer: Self-pay | Admitting: Nurse Practitioner

## 2014-01-02 ENCOUNTER — Telehealth: Payer: Self-pay | Admitting: *Deleted

## 2014-01-02 ENCOUNTER — Ambulatory Visit (INDEPENDENT_AMBULATORY_CARE_PROVIDER_SITE_OTHER): Payer: Commercial Managed Care - HMO | Admitting: Nurse Practitioner

## 2014-01-02 ENCOUNTER — Other Ambulatory Visit: Payer: Self-pay | Admitting: *Deleted

## 2014-01-02 VITALS — BP 90/60 | HR 64 | Ht <= 58 in | Wt 127.4 lb

## 2014-01-02 DIAGNOSIS — Q909 Down syndrome, unspecified: Secondary | ICD-10-CM

## 2014-01-02 DIAGNOSIS — K222 Esophageal obstruction: Secondary | ICD-10-CM

## 2014-01-02 DIAGNOSIS — R1310 Dysphagia, unspecified: Secondary | ICD-10-CM

## 2014-01-02 DIAGNOSIS — K2270 Barrett's esophagus without dysplasia: Secondary | ICD-10-CM

## 2014-01-02 DIAGNOSIS — R1314 Dysphagia, pharyngoesophageal phase: Secondary | ICD-10-CM

## 2014-01-02 MED ORDER — NYSTATIN 100000 UNIT/ML MT SUSP: 500000.0000 [IU] | Freq: Four times a day (QID) | OROMUCOSAL | Status: DC

## 2014-01-02 NOTE — Telephone Encounter (Signed)
Jill scheduled EGD for 01-30-2014 at 10:45 am

## 2014-01-02 NOTE — Telephone Encounter (Signed)
Would have to be at hosp with MAC, thanks

## 2014-01-02 NOTE — Telephone Encounter (Signed)
01-30-2014 at 10:30 am for EGD--MAC  Called patients [facility] [phone] Was told [facility], had to leave a message for a call back

## 2014-01-02 NOTE — Patient Instructions (Addendum)
You will hear from our office about scheduling an Upper Endoscopy, if Swallowing Test with Speech Pathology comes back that it is not a problem with swallowing then the Upper Endoscopy may not be needed. Dr. Christella HartiganJacobs will also review office notes from today with the chart.   Direct number for patients Nursing Home/Rehabiltion---[phone]

## 2014-01-02 NOTE — Telephone Encounter (Signed)
Dr. Christella HartiganJacobs,   Patient had an office visit with Willette ClusterPaula Guenther, NP today. Patient's nursing home is suppose to schedule patient for a swallowing evaluation, including SLP or MBSS. Question if patient needs an Upper Endoscopy with Dilatation and if he does need it do we schedule for hospital? Please advise

## 2014-01-03 ENCOUNTER — Encounter: Payer: Self-pay | Admitting: Nurse Practitioner

## 2014-01-03 DIAGNOSIS — R1310 Dysphagia, unspecified: Secondary | ICD-10-CM | POA: Insufficient documentation

## 2014-01-03 NOTE — Telephone Encounter (Signed)
[facility] for Jeremy GrossKay @ [phone] to call me back

## 2014-01-03 NOTE — Progress Notes (Signed)
     History of Present Illness:   Patient is a 63 year old male with Down's syndrome. We follow him for chronic GE junction strictures requiring periodic dilations. His last dilation was April 2014.  The nurse at facility where patient resides reported recurrent swallowing problems. Apparently patient recently diagnosed with dementia and I am unable to obtain much meaningful history from him today. He does indicated that throat is sore, it hurts to swallow. I called Nurse Burna MortimerWanda for clarification of swallowing issues. Patient not really swallowing his food, some spilling out side of mouth. On minced diet but taking twice as long to complete a meal now.    Current Medications, Allergies, Past Medical History, Past Surgical History, Family History and Social History were reviewed in [facility] electronic medical record.   Physical Exam: General: Pleasant white male in no acute distress Head: Normocephalic and atraumatic Eyes:  sclerae anicteric, conjunctiva pink  Ears: Normal auditory acuity Mouth: no obvious candida / lesions Lungs: Clear throughout to auscultation Heart: Regular rate and rhythm Abdomen: Soft, non distended, non-tender. No masses, no hepatomegaly. Normal bowel sounds Musculoskeletal: Symmetrical with no gross deformities  Extremities: No edema  Neurological: Alert oriented x 4, grossly nonfocal Psychological:  Alert and cooperative. Normal mood and affect  Assessment and Recommendations:  651. 63 year old male with Down's syndrome and dementia. On Namenda and Aricept  2. Hx of esophageal strictures requiring dilation, last one April 2014. On minced diet at group home where he resides. Patient is back with swallowing troubles. Though this is my first time seeing him, dysphagia seems acute (just started yesterday). He has associated sore throat and description of swallowing problems sounds like he may be developing some transfer dysphagia problems in addition to history  of esophageal strictures.   Will schedule for EGD with dilation. In the interim, I recommended speech therapy evaluation which can be done at group home (I spoke to nurse). Depending on SLP evaluation he may need MBSS. Patient's POA will come by office to sign consent. [facility]  Empirically treat with Nystatin give sore throat / odynophagia.   Continue PPI

## 2014-01-03 NOTE — Telephone Encounter (Signed)
Judeen Hammanshonda English, RN called me back, received my message from Barnetta ChapelEmma Per Rhonda patient is being set up with SLP and MBSS, their office is scheduling patient Per Bjorn LoserRhonda just fax over paper work and she will have patients POA sign it, then she will fax it back to us Per Bjorn Loserhonda she will make sure patient follows the no food or drink after midnight instructions for procedure Fax number is [phone]

## 2014-01-06 NOTE — Progress Notes (Signed)
i agree with the note and plan above

## 2014-01-08 ENCOUNTER — Encounter: Payer: Self-pay | Admitting: Neurology

## 2014-01-14 ENCOUNTER — Encounter: Payer: Self-pay | Admitting: Neurology

## 2014-01-21 ENCOUNTER — Encounter (HOSPITAL_COMMUNITY): Payer: Self-pay | Admitting: *Deleted

## 2014-01-29 ENCOUNTER — Ambulatory Visit (HOSPITAL_COMMUNITY): Payer: Commercial Managed Care - HMO | Admitting: Anesthesiology

## 2014-01-29 NOTE — Anesthesia Preprocedure Evaluation (Signed)
Anesthesia Evaluation  Patient identified by MRN, date of birth, ID band Patient awake    Reviewed: Allergy & Precautions, H&P , NPO status , Patient's Chart, lab work & pertinent test results  History of Anesthesia Complications Negative for: history of anesthetic complications  Airway Mallampati: II  TM Distance: >3 FB Neck ROM: Full    Dental no notable dental hx. (+) Dental Advisory Given   Pulmonary neg pulmonary ROS,  breath sounds clear to auscultation  Pulmonary exam normal       Cardiovascular negative cardio ROS  Rhythm:Regular Rate:Normal     Neuro/Psych PSYCHIATRIC DISORDERS Anxiety Depression negative neurological ROS     GI/Hepatic Neg liver ROS, GERD-  Medicated and Controlled,  Endo/Other  Hypothyroidism   Renal/GU negative Renal ROS  negative genitourinary   Musculoskeletal negative musculoskeletal ROS (+)   Abdominal   Peds negative pediatric ROS (+)  Hematology negative hematology ROS (+)   Anesthesia Other Findings Hx of Downs syndrome  Reproductive/Obstetrics negative OB ROS                             Anesthesia Physical Anesthesia Plan  ASA: III  Anesthesia Plan: MAC   Post-op Pain Management:    Induction: Intravenous  Airway Management Planned: Nasal Cannula  Additional Equipment:   Intra-op Plan:   Post-operative Plan: Extubation in OR  Informed Consent: I have reviewed the patients History and Physical, chart, labs and discussed the procedure including the risks, benefits and alternatives for the proposed anesthesia with the patient or authorized representative who has indicated his/her understanding and acceptance.   Dental advisory given  Plan Discussed with: CRNA  Anesthesia Plan Comments:         Anesthesia Quick Evaluation

## 2014-01-30 ENCOUNTER — Ambulatory Visit (HOSPITAL_COMMUNITY)
Admission: RE | Admit: 2014-01-30 | Discharge: 2014-01-30 | Disposition: A | Discharge status: Home / Self Care | Payer: Commercial Managed Care - HMO | Source: Ambulatory Visit | Attending: Gastroenterology | Admitting: Gastroenterology

## 2014-01-30 ENCOUNTER — Encounter (HOSPITAL_COMMUNITY)
Admission: RE | Disposition: A | Discharge status: Home / Self Care | Payer: Self-pay | Source: Ambulatory Visit | Attending: Gastroenterology

## 2014-01-30 ENCOUNTER — Encounter (HOSPITAL_COMMUNITY): Payer: Self-pay | Admitting: *Deleted

## 2014-01-30 ENCOUNTER — Telehealth: Payer: Self-pay

## 2014-01-30 DIAGNOSIS — K222 Esophageal obstruction: Secondary | ICD-10-CM | POA: Insufficient documentation

## 2014-01-30 DIAGNOSIS — K2270 Barrett's esophagus without dysplasia: Secondary | ICD-10-CM | POA: Insufficient documentation

## 2014-01-30 DIAGNOSIS — K219 Gastro-esophageal reflux disease without esophagitis: Secondary | ICD-10-CM | POA: Insufficient documentation

## 2014-01-30 DIAGNOSIS — Z539 Procedure and treatment not carried out, unspecified reason: Secondary | ICD-10-CM | POA: Insufficient documentation

## 2014-01-30 DIAGNOSIS — F0390 Unspecified dementia without behavioral disturbance: Secondary | ICD-10-CM | POA: Insufficient documentation

## 2014-01-30 DIAGNOSIS — E039 Hypothyroidism, unspecified: Secondary | ICD-10-CM | POA: Diagnosis not present

## 2014-01-30 DIAGNOSIS — R1314 Dysphagia, pharyngoesophageal phase: Secondary | ICD-10-CM

## 2014-01-30 DIAGNOSIS — Q909 Down syndrome, unspecified: Secondary | ICD-10-CM | POA: Diagnosis not present

## 2014-01-30 SURGERY — Surgical Case
Anesthesia: Monitor Anesthesia Care

## 2014-01-30 MED ORDER — PROPOFOL 10 MG/ML IV BOLUS
INTRAVENOUS | Status: AC
  Filled 2014-01-30: qty 20

## 2014-01-30 MED ORDER — LIDOCAINE HCL (CARDIAC) 20 MG/ML IV SOLN
INTRAVENOUS | Status: AC
  Filled 2014-01-30: qty 5

## 2014-01-30 SURGICAL SUPPLY — 14 items

## 2014-01-30 NOTE — H&P (View-Only) (Signed)
     History of Present Illness:   Patient is a 63 year old male with Down's syndrome. We follow him for chronic GE junction strictures requiring periodic dilations. His last dilation was April 2014.  The nurse at facility where patient resides reported recurrent swallowing problems. Apparently patient recently diagnosed with dementia and I am unable to obtain much meaningful history from him today. He does indicated that throat is sore, it hurts to swallow. I called Nurse Wanda for clarification of swallowing issues. Patient not really swallowing his food, some spilling out side of mouth. On minced diet but taking twice as long to complete a meal now.    Current Medications, Allergies, Past Medical History, Past Surgical History, Family History and Social History were reviewed in [facility] Link electronic medical record.   Physical Exam: General: Pleasant white male in no acute distress Head: Normocephalic and atraumatic Eyes:  sclerae anicteric, conjunctiva pink  Ears: Normal auditory acuity Mouth: no obvious candida / lesions Lungs: Clear throughout to auscultation Heart: Regular rate and rhythm Abdomen: Soft, non distended, non-tender. No masses, no hepatomegaly. Normal bowel sounds Musculoskeletal: Symmetrical with no gross deformities  Extremities: No edema  Neurological: Alert oriented x 4, grossly nonfocal Psychological:  Alert and cooperative. Normal mood and affect  Assessment and Recommendations:  1. 63 year old male with Down's syndrome and dementia. On Namenda and Aricept  2. Hx of esophageal strictures requiring dilation, last one April 2014. On minced diet at group home where he resides. Patient is back with swallowing troubles. Though this is my first time seeing him, dysphagia seems acute (just started yesterday). He has associated sore throat and description of swallowing problems sounds like he may be developing some transfer dysphagia problems in addition to history  of esophageal strictures.   Will schedule for EGD with dilation. In the interim, I recommended speech therapy evaluation which can be done at group home (I spoke to nurse). Depending on SLP evaluation he may need MBSS. Patient's POA will come by office to sign consent. Procedure will be done at [facility]  Empirically treat with Nystatin give sore throat / odynophagia.   Continue PPI            

## 2014-01-30 NOTE — Telephone Encounter (Signed)
-----   Message from Rachael Feeaniel P Jacobs, MD sent at 01/30/2014 10:36 AM EST ----- [name], Jeremy Murillo was here for EGD, dilation. No consent.  His NH was supposed to have had it signed and sent back to us or [facility] or ?  Currently DPOA not available, have left messages.    Can you please work with his NH to have appropriate consent signed by his DPOA, copy of it faxed to [facility] and directly to [facility] as well and reschedule him for EGD with MAC, dilation of esophageal stricture.  Thanks

## 2014-01-30 NOTE — Interval H&P Note (Signed)
History and Physical Interval Note:  01/30/2014 10:09 AM  Jeremy Murillo  has presented today for surgery, with the diagnosis of Barrett's esophagus  Esophageal Stricture  Dysphagia   The various methods of treatment have been discussed with the patient and family. After consideration of risks, benefits and other options for treatment, the patient has consented to  Procedure(s) with comments: ESOPHAGOGASTRODUODENOSCOPY (EGD) WITH PROPOFOL (N/A) - Dil as a surgical intervention .  The patient's history has been reviewed, patient examined, no change in status, stable for surgery.  I have reviewed the patient's chart and labs.  Questions were answered to the patient's satisfaction.     Rachael FeeJacobs, Daniel P

## 2014-02-07 NOTE — Telephone Encounter (Signed)
I spoke to RichwoodRhonda and she will call back to schedule the EGD, she states she is out of the office and will call next week to set up.

## 2014-02-10 ENCOUNTER — Other Ambulatory Visit: Payer: Self-pay

## 2014-02-10 DIAGNOSIS — R1314 Dysphagia, pharyngoesophageal phase: Secondary | ICD-10-CM

## 2014-02-10 NOTE — Telephone Encounter (Signed)
I spoke with Jeremy Murillo and she will make sure the consent is signed and faxed back and the POA will be available at the procedure on 03/06/14  Faxed to [phone].

## 2014-02-28 NOTE — Progress Notes (Signed)
Talked to Patty, Dr. Christella HartiganJacobs assistant letting her know that I [facility] stating they were having difficulty getting the consent from the guardian who lives out of state and getting a proxy and they were having some issues and were trying to work them out.

## 2014-03-03 NOTE — Progress Notes (Signed)
Spoke with Judeen Hammanshonda English LPN, patient has new instate guardian, nursing home case manager trying to get infor about new instate guardian, patient nephew mark Mayr is not returning nursing home emails or phone calls at this time, Judeen HammansRhonda English will call us back when guardian info obtained.

## 2014-03-04 ENCOUNTER — Encounter (HOSPITAL_COMMUNITY): Payer: Self-pay | Admitting: *Deleted

## 2014-03-04 NOTE — Progress Notes (Deleted)
Consent for egd signed by poa  03-06-14 faxed from nursing home and placed on pt chart

## 2014-03-06 ENCOUNTER — Ambulatory Visit (HOSPITAL_COMMUNITY)
Admission: RE | Admit: 2014-03-06 | Payer: Commercial Managed Care - HMO | Source: Ambulatory Visit | Admitting: Gastroenterology

## 2014-03-06 ENCOUNTER — Encounter (HOSPITAL_COMMUNITY): Payer: Self-pay | Admitting: Gastroenterology

## 2014-03-06 SURGERY — ESOPHAGOGASTRODUODENOSCOPY (EGD) WITH PROPOFOL
Anesthesia: Monitor Anesthesia Care

## 2014-09-15 ENCOUNTER — Emergency Department (HOSPITAL_COMMUNITY)
Admission: EM | Admit: 2014-09-15 | Discharge: 2014-09-15 | Disposition: A | Discharge status: Home / Self Care | Payer: Commercial Managed Care - HMO | Attending: Emergency Medicine | Admitting: Emergency Medicine

## 2014-09-15 ENCOUNTER — Encounter (HOSPITAL_COMMUNITY): Payer: Self-pay | Admitting: Emergency Medicine

## 2014-09-15 DIAGNOSIS — Q909 Down syndrome, unspecified: Secondary | ICD-10-CM | POA: Diagnosis not present

## 2014-09-15 DIAGNOSIS — Z8669 Personal history of other diseases of the nervous system and sense organs: Secondary | ICD-10-CM | POA: Insufficient documentation

## 2014-09-15 DIAGNOSIS — Z792 Long term (current) use of antibiotics: Secondary | ICD-10-CM | POA: Insufficient documentation

## 2014-09-15 DIAGNOSIS — F0390 Unspecified dementia without behavioral disturbance: Secondary | ICD-10-CM | POA: Insufficient documentation

## 2014-09-15 DIAGNOSIS — Z7952 Long term (current) use of systemic steroids: Secondary | ICD-10-CM | POA: Diagnosis not present

## 2014-09-15 DIAGNOSIS — R5383 Other fatigue: Secondary | ICD-10-CM

## 2014-09-15 DIAGNOSIS — E039 Hypothyroidism, unspecified: Secondary | ICD-10-CM | POA: Insufficient documentation

## 2014-09-15 DIAGNOSIS — Z8719 Personal history of other diseases of the digestive system: Secondary | ICD-10-CM | POA: Insufficient documentation

## 2014-09-15 DIAGNOSIS — Z79899 Other long term (current) drug therapy: Secondary | ICD-10-CM | POA: Insufficient documentation

## 2014-09-15 HISTORY — DX: Unspecified astigmatism, unspecified eye: H52.209

## 2014-09-15 HISTORY — DX: Hypothyroidism, unspecified: E03.9

## 2014-09-15 LAB — CBG MONITORING, ED: GLUCOSE-CAPILLARY: 112 mg/dL — AB (ref 65–99)

## 2014-09-15 LAB — CBC WITH DIFFERENTIAL/PLATELET
BASOS PCT: 1 % (ref 0–1)
Basophils Absolute: 0.1 10*3/uL (ref 0.0–0.1)
Eosinophils Absolute: 0.3 10*3/uL (ref 0.0–0.7)
Eosinophils Relative: 4 % (ref 0–5)
HCT: 40.4 % (ref 39.0–52.0)
HEMOGLOBIN: 13.4 g/dL (ref 13.0–17.0)
LYMPHS PCT: 12 % (ref 12–46)
Lymphs Abs: 1 10*3/uL (ref 0.7–4.0)
MCH: 31.4 pg (ref 26.0–34.0)
MCHC: 33.2 g/dL (ref 30.0–36.0)
MCV: 94.6 fL (ref 78.0–100.0)
MONO ABS: 0.7 10*3/uL (ref 0.1–1.0)
Monocytes Relative: 8 % (ref 3–12)
NEUTROS ABS: 5.9 10*3/uL (ref 1.7–7.7)
Neutrophils Relative %: 75 % (ref 43–77)
Platelets: 215 10*3/uL (ref 150–400)
RBC: 4.27 MIL/uL (ref 4.22–5.81)
RDW: 15.4 % (ref 11.5–15.5)
WBC: 7.9 10*3/uL (ref 4.0–10.5)

## 2014-09-15 LAB — BASIC METABOLIC PANEL
Anion gap: 4 — ABNORMAL LOW (ref 5–15)
BUN: 16 mg/dL (ref 6–20)
CO2: 28 mmol/L (ref 22–32)
CREATININE: 1.15 mg/dL (ref 0.61–1.24)
Calcium: 8.4 mg/dL — ABNORMAL LOW (ref 8.9–10.3)
Chloride: 105 mmol/L (ref 101–111)
GFR calc Af Amer: >60 mL/min (ref >60)
GLUCOSE: 107 mg/dL — AB (ref 65–99)
POTASSIUM: 3.9 mmol/L (ref 3.5–5.1)
Sodium: 137 mmol/L (ref 135–145)

## 2014-09-15 MED ORDER — SODIUM CHLORIDE 0.9 % IV BOLUS (SEPSIS)
1000.0000 mL | Freq: Once | INTRAVENOUS | Status: AC
  Administered 2014-09-15: 1000 mL via INTRAVENOUS

## 2014-09-15 NOTE — ED Notes (Signed)
Per EMS. Pt from adult day[facility]. Staff state pt was outside at noon today and looked overheated. Brought pt back inside and pt began drooling and leaning forward. No LOC or injury. Pt has hx of dementia and down's syndrome. Oriented per norm according to staff with the exception that pt is sleepy. EMS gave NS prior to arrival.

## 2014-09-15 NOTE — ED Provider Notes (Signed)
CSN: [id number]     Arrival date & time 09/15/14  1446 History   First MD Initiated Contact with Patient 09/15/14 1504     Chief Complaint  Patient presents with  . Fatigue     (Consider location/radiation/quality/duration/timing/severity/associated sxs/prior Treatment) HPI Comments: Patient with a history of Down's Syndrome and Dementia presents today from a group home after an episode where the staff felt that he appeared to be "overheated."  Staff report that around noon today while the patient was outside in the hot weather he began drooling and appeared very tired.  Therefore, they called EMS.  Patient given IVF by EMS and symptoms have improved at this time.  Staff report that he is at his baseline mentally at this time.  He did not fall or lose consciousness.  They did not observe any seizure activity.  No nausea, vomiting, diarrhea, fever, or chills.    History provided by: group home staff.    Past Medical History  Diagnosis Date  . Down's syndrome   . Unspecified hemorrhoids without mention of complication   . Stricture and stenosis of esophagus   . Esophageal reflux   . Barrett's esophagus   . Dysphagia, unspecified(787.20)   . Diaphragmatic hernia without mention of obstruction or gangrene   . Unspecified hypothyroidism   . Dementia   . Hypothyroid   . Astigmatism    Past Surgical History  Procedure Laterality Date  . None     Family History  Problem Relation Age of Onset  . Colon cancer Neg Hx   . Esophageal cancer Neg Hx   . Rectal cancer Neg Hx   . Stomach cancer Neg Hx    History  Substance Use Topics  . Smoking status: Never Smoker   . Smokeless tobacco: Never Used  . Alcohol Use: No    Review of Systems  All other systems reviewed and are negative.     Allergies  Review of patient's allergies indicates no known allergies.  Home Medications   Prior to Admission medications   Medication Sig Start Date End Date Taking? Authorizing Provider   cholecalciferol (VITAMIN D) 1000 UNITS tablet Take 2,000 Units by mouth daily.    Yes Historical Provider, MD  docusate sodium (COLACE) 100 MG capsule Take 100 mg by mouth at bedtime.   Yes Historical Provider, MD  donepezil (ARICEPT) 5 MG tablet Take 1 tablet (5 mg total) by mouth daily. 11/15/13  Yes Nilda Riggs, NP  esomeprazole (NEXIUM) 20 MG capsule Take 20 mg by mouth daily at 12 noon.   Yes Historical Provider, MD  hydrocortisone (ANUSOL-HC) 25 MG suppository Place 25 mg rectally 3 (three) times daily.   Yes Historical Provider, MD  levothyroxine (SYNTHROID, LEVOTHROID) 100 MCG tablet Take 100 mcg by mouth daily before breakfast.   Yes Historical Provider, MD  Memantine HCl ER (NAMENDA XR) 28 MG CP24 Take 28 mg by mouth daily. 11/15/13  Yes Nilda Riggs, NP  metroNIDAZOLE (METROGEL) 1 % gel Apply 1 application topically daily. Apply to affected areas of face for dermatitis/rosacea   Yes Historical Provider, MD  simvastatin (ZOCOR) 10 MG tablet Take 10 mg by mouth at bedtime.   Yes Historical Provider, MD  Skin Protectants, Misc. (EUCERIN) cream Apply 1 application topically as needed for dry skin.    Yes Historical Provider, MD  vitamin E (VITAMIN E) 200 UNIT capsule Take 200 Units by mouth daily.   Yes Historical Provider, MD  zinc oxide 20 % ointment  Apply 1 application topically 3 (three) times daily. Apply to buttocks and intergluteal fold   Yes Historical Provider, MD   BP 122/65 mmHg  Pulse 78  Temp(Src) 98.2 F (36.8 C) (Oral)  Resp 18  SpO2 97% Physical Exam  Constitutional: He appears well-developed and well-nourished. No distress.  HENT:  Head: Normocephalic and atraumatic.  Mouth/Throat: Oropharynx is clear and moist.  Eyes: EOM are normal. Pupils are equal, round, and reactive to light.  Neck: Normal range of motion. Neck supple.  Cardiovascular: Normal rate, regular rhythm and normal heart sounds.   Pulmonary/Chest: Effort normal and breath sounds  normal.  Abdominal: Soft. Bowel sounds are normal.  Neurological: He is alert. No cranial nerve deficit. He exhibits normal muscle tone. He displays no seizure activity.  Skin: Skin is warm and dry. He is not diaphoretic.  Psychiatric: He has a normal mood and affect.  Nursing note and vitals reviewed.   ED Course  Procedures (including critical care time) Labs Review Labs Reviewed  CBC WITH DIFFERENTIAL/PLATELET  BASIC METABOLIC PANEL    Imaging Review No results found.   EKG Interpretation None      MDM   Final diagnoses:  None   Patient with a history of Down's Syndrome and Dementia brought in today via EMS after being called by staff at Group Home.  Staff was concerned that the patient got "overheated" while outside.  Group Home Manager is with the patient in the ED today and reports that he is at baseline mentally.  VSS.  Labs unremarkable.  Feel that the patient is stable for discharge.  Return precautions given.    Santiago Glad, PA-C 09/16/14 0002  Mancel Bale, MD 09/16/14 [phone]

## 2014-09-15 NOTE — ED Notes (Signed)
Bed: WA16 Expected date:  Expected time:  Means of arrival:  Comments: EMS/AMS 

## 2014-11-14 ENCOUNTER — Ambulatory Visit: Payer: Medicare HMO | Admitting: Gastroenterology

## 2014-11-17 ENCOUNTER — Ambulatory Visit (INDEPENDENT_AMBULATORY_CARE_PROVIDER_SITE_OTHER): Payer: Commercial Managed Care - HMO | Admitting: Nurse Practitioner

## 2014-11-17 ENCOUNTER — Encounter: Payer: Self-pay | Admitting: Nurse Practitioner

## 2014-11-17 VITALS — BP 110/71 | HR 85 | Ht <= 58 in | Wt 121.8 lb

## 2014-11-17 DIAGNOSIS — Q909 Down syndrome, unspecified: Secondary | ICD-10-CM

## 2014-11-17 DIAGNOSIS — R413 Other amnesia: Secondary | ICD-10-CM | POA: Diagnosis not present

## 2014-11-17 MED ORDER — MEMANTINE HCL ER 28 MG PO CP24: 28.0000 mg | ORAL_CAPSULE | Freq: Every day | ORAL | Status: AC

## 2014-11-17 MED ORDER — DONEPEZIL HCL 5 MG PO TABS: 5.0000 mg | ORAL_TABLET | Freq: Every day | ORAL | Status: AC

## 2014-11-17 NOTE — Progress Notes (Signed)
I have reviewed and agreed above plan. 

## 2014-11-17 NOTE — Progress Notes (Signed)
[facility]  PATIENT: Jeremy Murillo DOB: 03/24/1950   REASON FOR VISIT: Down syndrome, memory loss HISTORY FROM: Group home representative    HISTORY OF PRESENT ILLNESS:Jeremy Murillo, 64 year old male returns for followup. He was initially evaluated 11/12/2012 by Dr. Terrace Arabia for declining functional status. He was last seen 11/19/13. He lives in a group home. He requires assistance with all activities of daily living except he can feed himself, needs constant reminders to eat. Much difficulty with following commands. He has not fallen. He wears depends for urinary incontinence. Labs for dementia all returned normal. CT of the head with generalized atrophy and small vessel disease. He is on Namenda  which has helped his behavior. He is also on Aricept. He goes to a day program. There have been no behavioral issues recently He returns for reevaluation.   HISTORY: evaluation of declining functional status, he is accompanied by his group home caregiver Joyce Gross at today's clinical visit. He lives at [facility] since March 2012.  He has past medical history of esophageal constriction, Gerd, hemorrhoid, esophagitis, Down's syndrome.  When he first reached current group home, he was able to dress himself, using bathroom, eating without any difficulty, he needs some help in bathing, but over the past 6 months, he was noticed to have a steady decline in functional status, he needs to be reminded many times to finish his dishes, he also has trouble controlling his bowel and bladder.  He denies significant gait difficulty, sleeping well,      REVIEW OF SYSTEMS: Full 14 system review of systems performed and notable only for those listed, all others are neg:  Constitutional: neg  Cardiovascular: neg Ear/Nose/Throat: neg  Skin: neg Eyes: neg Respiratory: neg Gastroitestinal: neg  Hematology/Lymphatic: neg  Endocrine:  neg Musculoskeletal:neg Allergy/Immunology: neg Neurological: Memory loss, Down syndrome Psychiatric: neg Sleep : neg   ALLERGIES: No Known Allergies  HOME MEDICATIONS: Outpatient Prescriptions Prior to Visit  Medication Sig Dispense Refill  . docusate sodium (COLACE) 100 MG capsule Take 100 mg by mouth at bedtime.    . donepezil (ARICEPT) 5 MG tablet Take 1 tablet (5 mg total) by mouth daily. 30 tablet 11  . esomeprazole (NEXIUM) 20 MG capsule Take 20 mg by mouth daily at 12 noon.    . hydrocortisone (ANUSOL-HC) 25 MG suppository Place 25 mg rectally 3 (three) times daily as needed for hemorrhoids or itching.     . levothyroxine (SYNTHROID, LEVOTHROID) 100 MCG tablet Take 100 mcg by mouth daily before breakfast.    . Memantine HCl ER (NAMENDA XR) 28 MG CP24 Take 28 mg by mouth daily. 30 capsule 11  . simvastatin (ZOCOR) 10 MG tablet Take 10 mg by mouth at bedtime.    . Skin Protectants, Misc. (EUCERIN) cream Apply 1 application topically as needed for dry skin.     Marland Kitchen vitamin E (VITAMIN E) 200 UNIT capsule Take 200 Units by mouth daily.    . cholecalciferol (VITAMIN D) 1000 UNITS tablet Take 2,000 Units by mouth daily.      No facility-administered medications prior to visit.    PAST MEDICAL HISTORY: Past Medical History  Diagnosis Date  . Down's syndrome   . Unspecified hemorrhoids without mention of complication   . Stricture and stenosis of esophagus   . Esophageal reflux   . Barrett's esophagus   . Dysphagia, unspecified(787.20)   . Diaphragmatic hernia without mention of obstruction or gangrene   . Unspecified hypothyroidism   .  Dementia   . Hypothyroid   . Astigmatism     PAST SURGICAL HISTORY: Past Surgical History  Procedure Laterality Date  . None      FAMILY HISTORY: Family History  Problem Relation Age of Onset  . Colon cancer Neg Hx   . Esophageal cancer Neg Hx   . Rectal cancer Neg Hx   . Stomach cancer Neg Hx     SOCIAL HISTORY: Social History    Social History  . Marital Status: Single    Spouse Name: N/A  . Number of Children: 0  . Years of Education: N/A   Occupational History  . Disabled    Social History Main Topics  . Smoking status: Never Smoker   . Smokeless tobacco: Never Used  . Alcohol Use: No  . Drug Use: No  . Sexual Activity: No   Other Topics Concern  . Not on file   Social History Narrative   Patient lives in a group  Home.    Caffeine- very rare.   Right handed.     PHYSICAL EXAM  Filed Vitals:   11/17/14 0854  BP: 110/71  Pulse: 85  Height:  (1.473 m)  Weight: 121 lb 12.8 oz (55.248 kg)   Body mass index is 25.46 kg/(m^2). Generalized: Well developed, in no acute distress , well groomed Head: normocephalic and atraumatic,. Oropharynx benign  Neck: Supple, no carotid bruits  Cardiac: Regular rate rhythm, no murmur  Musculoskeletal: No deformity  Neurological examination  Mentation: Awake follows one-step commands, little speech  Cranial nerve II-XII: Pupils were equal round reactive to light extraocular movements were full, visual field were full on confrontational test. Facial sensation and strength were normal. hearing was intact to finger rubbing bilaterally. Uvula tongue midline. head turning and shoulder shrug were normal and symmetric.Tongue protrusion into cheek strength was normal.  Motor: normal bulk and tone, full strength in the BUE, BLE, No focal weakness  Coordination: Unable to perform  Reflexes: Brachioradialis 2/2, biceps 2/2, triceps 2/2, patellar 2/2, Achilles trace plantar responses were flexor bilaterally.  Gait and Station: Rising up from seated position without assistance, cautious gait, no assistive device   DIAGNOSTIC DATA (LABS, IMAGING, TESTING) - I reviewed patient records, labs, notes, testing and imaging myself where available.  Lab Results  Component Value Date   WBC 7.9 09/15/2014   HGB 13.4 09/15/2014   HCT 40.4 09/15/2014   MCV  94.6 09/15/2014   PLT 215 09/15/2014      Component Value Date/Time   NA 137 09/15/2014 1532   NA 140 11/12/2012 1006   K 3.9 09/15/2014 1532   CL 105 09/15/2014 1532   CO2 28 09/15/2014 1532   GLUCOSE 107* 09/15/2014 1532   GLUCOSE 71 11/12/2012 1006   BUN 16 09/15/2014 1532   BUN 18 11/12/2012 1006   CREATININE 1.15 09/15/2014 1532   CALCIUM 8.4* 09/15/2014 1532   PROT 7.2 11/12/2012 1006   PROT 7.5 12/22/2009 1125   ALBUMIN 3.6 12/22/2009 1125   AST 24 11/12/2012 1006   ALT 15 11/12/2012 1006   ALKPHOS 94 11/12/2012 1006   BILITOT 0.3 11/12/2012 1006   GFRNONAA >60 09/15/2014 1532   GFRAA >60 09/15/2014 1532     ASSESSMENT AND PLAN  64 y.o. year old male  has a past medical history of Down's syndrome; Stricture and stenosis of esophagus; Esophageal reflux; Barrett's esophagus; Dysphagia, unspecified(787.20); Dementia; here to follow-up for his memory loss.  Continue Aricept 5 mg daily Continue  Namenda 28 mg extended release daily I spent 10 additional in total face to face time with the caregiver representative from group home who is new to the facility and she has questions which were answered. More than 50% of which was spent counseling and coordination of care, reviewing test results reviewing medications and discussing and reviewing the diagnosis of memory loss, Down syndrome and further treatment options. Made aware that memory loss is a progressive disease process. Nilda Riggs, [facility], [facility], APRN  [facility] [address] [place], [place] [address] [phone]

## 2014-11-17 NOTE — Patient Instructions (Signed)
Per group home sheet 

## 2014-11-18 ENCOUNTER — Telehealth: Payer: Self-pay | Admitting: Neurology

## 2014-11-18 NOTE — Telephone Encounter (Signed)
[facility] request for form to be faxed to [phone]

## 2014-11-18 NOTE — Telephone Encounter (Signed)
[facility] [phone] called to request referral sheet for visit with Dr. Terrace Arabia yesterday and to be dated for 11/17/14 vs POS sheet.

## 2014-11-18 NOTE — Telephone Encounter (Signed)
Patient was seen 11/17/14 - spoke to [place] and he stated he has the information needed to send to [facility] on the patient's behalf.

## 2015-01-13 ENCOUNTER — Encounter: Payer: Self-pay | Admitting: Gastroenterology

## 2015-01-13 ENCOUNTER — Ambulatory Visit (INDEPENDENT_AMBULATORY_CARE_PROVIDER_SITE_OTHER): Payer: Commercial Managed Care - HMO | Admitting: Gastroenterology

## 2015-01-13 VITALS — BP 100/62 | HR 68 | Ht <= 58 in | Wt 112.0 lb

## 2015-01-13 DIAGNOSIS — R1314 Dysphagia, pharyngoesophageal phase: Secondary | ICD-10-CM | POA: Diagnosis not present

## 2015-01-13 NOTE — Patient Instructions (Signed)
Please call Dr. Christella HartiganJacobs' office if Eulah CitizenStevie appears to be having swallowing troubles.

## 2015-01-13 NOTE — Progress Notes (Signed)
Review of pertinent gastrointestinal problems:  1. 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 2011 required 3 dilations. 11/12 EGD jacobs, dilated to 18mm. 4/14 EGD jacobs, dilated to 18mm.  01/2014 [facility] without signed consent despite numerous efforts by office staff to have this available.  HPI: This is a  very pleasant 64 year old man with Down syndrome I last saw him about a year ago.  Usually he has been accompanied by his niece however she is not here today.    Chief complaint is "Jeremy Murillo is eating normally"  Care giver says he eats slowly.  She does not think he's having any trouble swallowing.   She has known him for about 3 months  Has been eating puree diet.   Past Medical History  Diagnosis Date  . Down's syndrome   . Unspecified hemorrhoids without mention of complication   . Stricture and stenosis of esophagus   . Esophageal reflux   . Barrett's esophagus   . Dysphagia, unspecified(787.20)   . Diaphragmatic hernia without mention of obstruction or gangrene   . Unspecified hypothyroidism   . Dementia   . Hypothyroid   . Astigmatism     Past Surgical History  Procedure Laterality Date  . None      Current Outpatient Prescriptions  Medication Sig Dispense Refill  . acetaminophen (TYLENOL) 325 MG tablet Take by mouth every 4 (four) hours as needed.    Marland Kitchen. alum & mag hydroxide-simeth (MAALOX/MYLANTA) 200-200-20 MG/5ML suspension Take by mouth as needed for indigestion or heartburn.    . bisacodyl (DULCOLAX) 10 MG suppository Place 10 mg rectally as needed for moderate constipation.    . brompheniramine-pseudoephedrine (DIMETAPP) 1-15 MG/5ML ELIX Take by mouth as needed for allergies.    Marland Kitchen. docusate sodium (COLACE) 100 MG capsule Take 100 mg by mouth at bedtime.    . donepezil (ARICEPT) 5 MG tablet Take 1 tablet (5 mg total) by mouth daily. 30 tablet 11  . esomeprazole (NEXIUM) 20 MG capsule Take 20 mg by mouth daily  at 12 noon.    Marland Kitchen. guaifenesin (ROBITUSSIN) 100 MG/5ML syrup Take 200 mg by mouth as needed for cough.    . hydrocortisone (ANUSOL-HC) 25 MG suppository Place 25 mg rectally 3 (three) times daily as needed for hemorrhoids or itching.     . levothyroxine (SYNTHROID, LEVOTHROID) 100 MCG tablet Take 100 mcg by mouth daily before breakfast.    . loperamide (IMODIUM A-D) 2 MG tablet Take 2 mg by mouth as needed for diarrhea or loose stools.    . magnesium hydroxide (MILK OF MAGNESIA) 400 MG/5ML suspension Take by mouth daily as needed for mild constipation.    . memantine (NAMENDA XR) 28 MG CP24 24 hr capsule Take 1 capsule (28 mg total) by mouth daily. 30 capsule 11  . metroNIDAZOLE (METROGEL) 1 % gel Apply 1 application topically daily.    . promethazine (PHENERGAN) 25 MG tablet Take 25 mg by mouth every 4 (four) hours as needed for nausea or vomiting.    . simvastatin (ZOCOR) 10 MG tablet Take 10 mg by mouth at bedtime.    . Skin Protectants, Misc. (EUCERIN) cream Apply 1 application topically as needed for dry skin.     Marland Kitchen. vitamin E (VITAMIN E) 200 UNIT capsule Take 200 Units by mouth daily.     No current facility-administered medications for this visit.    Allergies as of 01/13/2015  . (No Known Allergies)  Family History  Problem Relation Age of Onset  . Colon cancer Neg Hx   . Esophageal cancer Neg Hx   . Rectal cancer Neg Hx   . Stomach cancer Neg Hx     Social History   Social History  . Marital Status: Single    Spouse Name: N/A  . Number of Children: 0  . Years of Education: N/A   Occupational History  . Disabled    Social History Main Topics  . Smoking status: Never Smoker   . Smokeless tobacco: Never Used  . Alcohol Use: No  . Drug Use: No  . Sexual Activity: No   Other Topics Concern  . Not on file   Social History Narrative   Patient lives in a group [facility].    Caffeine- very rare.   Right handed.     Physical Exam: BP 100/62 mmHg  Pulse  68  Ht  (1.448 m)  Wt 112 lb (50.803 kg)  BMI 24.23 kg/m2 Constitutional: generally well-appearing Psychiatric: alert and oriented x3 Abdomen: soft, nontender, nondistended, no obvious ascites, no peritoneal signs, normal bowel sounds   Assessment and plan: 64 y.o. male with esophageal stricture  It is very difficult to tell Jeremy Murillo is having trouble with dysphagia again. He has significant Down syndrome. He has always eaten quite slowly and that seems normal for him. His caregiver who is with him today says that she has not noticed any significant changes in his eating habits. He has not been throwing up food or making any type of movements that would suggest he is to having difficulty swallowing. I explained to her if they do notice that at his facility they should please call here that we could repeat esophageal dilation as an outpatient if needed.   Rob Bunting, MD [facility] 01/13/2015, 2:48 PM

## 2015-08-19 ENCOUNTER — Inpatient Hospital Stay (HOSPITAL_COMMUNITY)
Admission: EM | Admit: 2015-08-19 | Discharge: 2015-08-21 | DRG: 871 | Disposition: A | Discharge status: Home / Self Care | Payer: Commercial Managed Care - HMO | Attending: Internal Medicine | Admitting: Internal Medicine

## 2015-08-19 ENCOUNTER — Emergency Department (HOSPITAL_COMMUNITY): Payer: Commercial Managed Care - HMO

## 2015-08-19 ENCOUNTER — Observation Stay (HOSPITAL_COMMUNITY): Payer: Commercial Managed Care - HMO

## 2015-08-19 ENCOUNTER — Encounter (HOSPITAL_COMMUNITY): Payer: Self-pay

## 2015-08-19 DIAGNOSIS — I10 Essential (primary) hypertension: Secondary | ICD-10-CM | POA: Diagnosis present

## 2015-08-19 DIAGNOSIS — J690 Pneumonitis due to inhalation of food and vomit: Secondary | ICD-10-CM | POA: Diagnosis present

## 2015-08-19 DIAGNOSIS — Z8719 Personal history of other diseases of the digestive system: Secondary | ICD-10-CM

## 2015-08-19 DIAGNOSIS — R1310 Dysphagia, unspecified: Secondary | ICD-10-CM

## 2015-08-19 DIAGNOSIS — Z8701 Personal history of pneumonia (recurrent): Secondary | ICD-10-CM

## 2015-08-19 DIAGNOSIS — F0390 Unspecified dementia without behavioral disturbance: Secondary | ICD-10-CM | POA: Diagnosis present

## 2015-08-19 DIAGNOSIS — A419 Sepsis, unspecified organism: Secondary | ICD-10-CM | POA: Diagnosis not present

## 2015-08-19 DIAGNOSIS — R531 Weakness: Secondary | ICD-10-CM | POA: Diagnosis not present

## 2015-08-19 DIAGNOSIS — I959 Hypotension, unspecified: Secondary | ICD-10-CM | POA: Diagnosis not present

## 2015-08-19 DIAGNOSIS — K222 Esophageal obstruction: Secondary | ICD-10-CM

## 2015-08-19 DIAGNOSIS — R404 Transient alteration of awareness: Secondary | ICD-10-CM

## 2015-08-19 DIAGNOSIS — E860 Dehydration: Secondary | ICD-10-CM | POA: Diagnosis present

## 2015-08-19 DIAGNOSIS — K5909 Other constipation: Secondary | ICD-10-CM | POA: Diagnosis present

## 2015-08-19 DIAGNOSIS — D649 Anemia, unspecified: Secondary | ICD-10-CM | POA: Diagnosis present

## 2015-08-19 DIAGNOSIS — Q909 Down syndrome, unspecified: Secondary | ICD-10-CM | POA: Diagnosis not present

## 2015-08-19 DIAGNOSIS — K219 Gastro-esophageal reflux disease without esophagitis: Secondary | ICD-10-CM | POA: Diagnosis present

## 2015-08-19 DIAGNOSIS — K649 Unspecified hemorrhoids: Secondary | ICD-10-CM | POA: Diagnosis present

## 2015-08-19 DIAGNOSIS — N390 Urinary tract infection, site not specified: Secondary | ICD-10-CM | POA: Diagnosis present

## 2015-08-19 DIAGNOSIS — R32 Unspecified urinary incontinence: Secondary | ICD-10-CM | POA: Diagnosis present

## 2015-08-19 DIAGNOSIS — K449 Diaphragmatic hernia without obstruction or gangrene: Secondary | ICD-10-CM

## 2015-08-19 DIAGNOSIS — B9620 Unspecified Escherichia coli [E. coli] as the cause of diseases classified elsewhere: Secondary | ICD-10-CM | POA: Diagnosis present

## 2015-08-19 DIAGNOSIS — R4182 Altered mental status, unspecified: Secondary | ICD-10-CM | POA: Diagnosis present

## 2015-08-19 DIAGNOSIS — E039 Hypothyroidism, unspecified: Secondary | ICD-10-CM | POA: Diagnosis present

## 2015-08-19 DIAGNOSIS — G9341 Metabolic encephalopathy: Secondary | ICD-10-CM | POA: Diagnosis present

## 2015-08-19 DIAGNOSIS — R042 Hemoptysis: Secondary | ICD-10-CM

## 2015-08-19 LAB — URINE MICROSCOPIC-ADD ON

## 2015-08-19 LAB — COMPREHENSIVE METABOLIC PANEL
ALT: 21 U/L (ref 17–63)
AST: 31 U/L (ref 15–41)
Albumin: 3.1 g/dL — ABNORMAL LOW (ref 3.5–5.0)
Alkaline Phosphatase: 88 U/L (ref 38–126)
Anion gap: 8 (ref 5–15)
BUN: 17 mg/dL (ref 6–20)
CHLORIDE: 103 mmol/L (ref 101–111)
CO2: 25 mmol/L (ref 22–32)
CREATININE: 1.29 mg/dL — AB (ref 0.61–1.24)
Calcium: 8.9 mg/dL (ref 8.9–10.3)
GFR calc Af Amer: >60 mL/min (ref >60)
GFR calc non Af Amer: 57 mL/min — ABNORMAL LOW (ref >60)
Glucose, Bld: 143 mg/dL — ABNORMAL HIGH (ref 65–99)
POTASSIUM: 4.1 mmol/L (ref 3.5–5.1)
SODIUM: 136 mmol/L (ref 135–145)
Total Bilirubin: 0.5 mg/dL (ref 0.3–1.2)
Total Protein: 6.6 g/dL (ref 6.5–8.1)

## 2015-08-19 LAB — URINALYSIS, ROUTINE W REFLEX MICROSCOPIC
Bilirubin Urine: NEGATIVE
GLUCOSE, UA: NEGATIVE mg/dL
HGB URINE DIPSTICK: NEGATIVE
KETONES UR: NEGATIVE mg/dL
Nitrite: POSITIVE — AB
PROTEIN: NEGATIVE mg/dL
Specific Gravity, Urine: 1.016 (ref 1.005–1.030)
pH: 5.5 (ref 5.0–8.0)

## 2015-08-19 LAB — MRSA PCR SCREENING: MRSA by PCR: NEGATIVE

## 2015-08-19 LAB — CBG MONITORING, ED: Glucose-Capillary: 103 mg/dL — ABNORMAL HIGH (ref 65–99)

## 2015-08-19 LAB — I-STAT TROPONIN, ED: TROPONIN I, POC: 0 ng/mL (ref 0.00–0.08)

## 2015-08-19 LAB — LACTIC ACID, PLASMA
Lactic Acid, Venous: 1.1 mmol/L (ref 0.5–1.9)
Lactic Acid, Venous: 1 mmol/L (ref 0.5–1.9)

## 2015-08-19 LAB — STREP PNEUMONIAE URINARY ANTIGEN: Strep Pneumo Urinary Antigen: NEGATIVE

## 2015-08-19 LAB — CBC
HEMATOCRIT: 40.4 % (ref 39.0–52.0)
Hemoglobin: 13.2 g/dL (ref 13.0–17.0)
MCH: 30.3 pg (ref 26.0–34.0)
MCHC: 32.7 g/dL (ref 30.0–36.0)
MCV: 92.9 fL (ref 78.0–100.0)
Platelets: 199 10*3/uL (ref 150–400)
RBC: 4.35 MIL/uL (ref 4.22–5.81)
RDW: 15.2 % (ref 11.5–15.5)
WBC: 15.1 10*3/uL — AB (ref 4.0–10.5)

## 2015-08-19 LAB — DIFFERENTIAL
BASOS ABS: 0 10*3/uL (ref 0.0–0.1)
BASOS PCT: 0 %
EOS ABS: 0 10*3/uL (ref 0.0–0.7)
Eosinophils Relative: 0 %
Lymphocytes Relative: 3 %
Lymphs Abs: 0.5 10*3/uL — ABNORMAL LOW (ref 0.7–4.0)
Monocytes Absolute: 0.5 10*3/uL (ref 0.1–1.0)
Monocytes Relative: 3 %
NEUTROS PCT: 94 %
Neutro Abs: 14.1 10*3/uL — ABNORMAL HIGH (ref 1.7–7.7)

## 2015-08-19 LAB — I-STAT CG4 LACTIC ACID, ED
LACTIC ACID, VENOUS: 2.19 mmol/L — AB (ref 0.5–1.9)
Lactic Acid, Venous: 0.87 mmol/L (ref 0.5–1.9)

## 2015-08-19 LAB — TSH: TSH: 1.618 u[IU]/mL (ref 0.350–4.500)

## 2015-08-19 MED ORDER — PROMETHAZINE HCL 25 MG PO TABS: 25.0000 mg | ORAL_TABLET | ORAL | Status: DC | PRN

## 2015-08-19 MED ORDER — LEVOTHYROXINE SODIUM 100 MCG PO TABS
100.0000 ug | ORAL_TABLET | Freq: Every day | ORAL | Status: DC
  Administered 2015-08-20 – 2015-08-21 (×2): 100 ug via ORAL
  Filled 2015-08-19 (×2): qty 1

## 2015-08-19 MED ORDER — MEMANTINE HCL ER 28 MG PO CP24
28.0000 mg | ORAL_CAPSULE | Freq: Every day | ORAL | Status: DC
  Administered 2015-08-20 – 2015-08-21 (×2): 28 mg via ORAL
  Filled 2015-08-19 (×2): qty 1

## 2015-08-19 MED ORDER — SODIUM CHLORIDE 0.9 % IV SOLN
INTRAVENOUS | Status: DC
  Administered 2015-08-19 – 2015-08-20 (×3): via INTRAVENOUS

## 2015-08-19 MED ORDER — DONEPEZIL HCL 5 MG PO TABS
5.0000 mg | ORAL_TABLET | Freq: Every day | ORAL | Status: DC
  Administered 2015-08-20 – 2015-08-21 (×2): 5 mg via ORAL
  Filled 2015-08-19 (×2): qty 1

## 2015-08-19 MED ORDER — ENOXAPARIN SODIUM 40 MG/0.4ML ~~LOC~~ SOLN
40.0000 mg | SUBCUTANEOUS | Status: DC
  Administered 2015-08-19 – 2015-08-20 (×2): 40 mg via SUBCUTANEOUS
  Filled 2015-08-19 (×2): qty 0.4

## 2015-08-19 MED ORDER — METRONIDAZOLE 0.75 % EX GEL
1.0000 "application " | Freq: Every day | CUTANEOUS | Status: DC
  Administered 2015-08-19 – 2015-08-21 (×3): 1 via TOPICAL
  Filled 2015-08-19: qty 45

## 2015-08-19 MED ORDER — SODIUM CHLORIDE 0.9 % IV SOLN
Freq: Once | INTRAVENOUS | Status: AC
  Administered 2015-08-19: 13:00:00 via INTRAVENOUS

## 2015-08-19 MED ORDER — SIMVASTATIN 10 MG PO TABS
10.0000 mg | ORAL_TABLET | Freq: Every day | ORAL | Status: DC
  Administered 2015-08-20: 10 mg via ORAL
  Filled 2015-08-19: qty 1

## 2015-08-19 MED ORDER — HYDROCORTISONE ACETATE 25 MG RE SUPP
25.0000 mg | Freq: Three times a day (TID) | RECTAL | Status: DC | PRN
Start: 2015-08-19 — End: 2015-08-21

## 2015-08-19 MED ORDER — SODIUM CHLORIDE 0.9 % IV BOLUS (SEPSIS)
1000.0000 mL | Freq: Once | INTRAVENOUS | Status: AC
  Administered 2015-08-19: 1000 mL via INTRAVENOUS

## 2015-08-19 MED ORDER — SODIUM CHLORIDE 0.9 % IV SOLN
3.0000 g | Freq: Three times a day (TID) | INTRAVENOUS | Status: DC
  Administered 2015-08-19 – 2015-08-21 (×7): 3 g via INTRAVENOUS
  Filled 2015-08-19 (×11): qty 3

## 2015-08-19 MED ORDER — BISACODYL 10 MG RE SUPP: 10.0000 mg | RECTAL | Status: DC | PRN

## 2015-08-19 NOTE — ED Provider Notes (Addendum)
CSN: [id number]     Arrival date & time 08/19/15  1038 History   First MD Initiated Contact with Patient 08/19/15 1106     Chief Complaint  Patient presents with  . Weakness     (Consider location/radiation/quality/duration/timing/severity/associated sxs/prior Treatment) Patient is a 65 y.o. male presenting with weakness.  Weakness This is a new problem. The problem occurs constantly. The problem has not changed since onset.Pertinent negatives include no chest pain and no shortness of breath. Nothing aggravates the symptoms. Nothing relieves the symptoms. He has tried nothing for the symptoms. The treatment provided no relief.    Past Medical History  Diagnosis Date  . Down's syndrome   . Unspecified hemorrhoids without mention of complication   . Stricture and stenosis of esophagus   . Esophageal reflux   . Barrett's esophagus   . Dysphagia, unspecified(787.20)   . Diaphragmatic hernia without mention of obstruction or gangrene   . Unspecified hypothyroidism   . Dementia   . Hypothyroid   . Astigmatism    Past Surgical History  Procedure Laterality Date  . None     Family History  Problem Relation Age of Onset  . Colon cancer Neg Hx   . Esophageal cancer Neg Hx   . Rectal cancer Neg Hx   . Stomach cancer Neg Hx    Social History  Substance Use Topics  . Smoking status: Never Smoker   . Smokeless tobacco: Never Used  . Alcohol Use: No    Review of Systems  Unable to perform ROS: Dementia  Respiratory: Negative for shortness of breath.   Cardiovascular: Negative for chest pain.  Neurological: Positive for weakness.      Allergies  Review of patient's allergies indicates no known allergies.  Home Medications   Prior to Admission medications   Medication Sig Start Date End Date Taking? Authorizing Provider  acetaminophen (TYLENOL) 325 MG tablet Take 650 mg by mouth every 4 (four) hours as needed for mild pain or fever.    Yes Historical Provider, MD  alum  & mag hydroxide-simeth (MAALOX/MYLANTA) 200-200-20 MG/5ML suspension Take by mouth as needed for indigestion or heartburn.   Yes Historical Provider, MD  bisacodyl (DULCOLAX) 10 MG suppository Place 10 mg rectally as needed for moderate constipation.   Yes Historical Provider, MD  brompheniramine-pseudoephedrine (DIMETAPP) 1-15 MG/5ML ELIX Take by mouth as needed for allergies.   Yes Historical Provider, MD  carbamide peroxide (DEBROX) 6.5 % otic solution 5 drops 2 (two) times daily.   Yes Historical Provider, MD  coal tar (NEUTROGENA T-GEL) 0.5 % shampoo Apply 1 application topically every evening.    Yes Historical Provider, MD  Diethyltoluamide (OFF DEEP WOODS DRY) AERO Apply 1 application topically as needed.   Yes Historical Provider, MD  diphenhydrAMINE (BENADRYL) 25 MG tablet Take 25 mg by mouth every 4 (four) hours as needed.   Yes Historical Provider, MD  docusate sodium (COLACE) 100 MG capsule Take 100 mg by mouth at bedtime.   Yes Historical Provider, MD  donepezil (ARICEPT) 5 MG tablet Take 1 tablet (5 mg total) by mouth daily. 11/17/14  Yes Nilda Riggs, NP  esomeprazole (NEXIUM) 20 MG capsule Take 20 mg by mouth 2 (two) times daily before a meal.    Yes Historical Provider, MD  hydrocortisone (ANUSOL-HC) 25 MG suppository Place 25 mg rectally 3 (three) times daily as needed for hemorrhoids or itching.    Yes Historical Provider, MD  levothyroxine (SYNTHROID, LEVOTHROID) 100 MCG tablet Take 100  mcg by mouth daily before breakfast.   Yes Historical Provider, MD  lip balm (CARMEX) ointment Apply 1 application topically as needed for lip care.   Yes Historical Provider, MD  loperamide (IMODIUM A-D) 2 MG tablet Take 4 mg by mouth as needed for diarrhea or loose stools.    Yes Historical Provider, MD  magnesium hydroxide (MILK OF MAGNESIA) 400 MG/5ML suspension Take by mouth daily as needed for mild constipation.   Yes Historical Provider, MD  memantine (NAMENDA XR) 28 MG CP24 24 hr  capsule Take 1 capsule (28 mg total) by mouth daily. 11/17/14  Yes Nilda RiggsNancy Carolyn Martin, NP  metroNIDAZOLE (METROGEL) 1 % gel Apply 1 application topically daily.   Yes Historical Provider, MD  neomycin-bacitracin-polymyxin (NEOSPORIN) ointment Apply 1 application topically as needed for wound care. apply to eye   Yes Historical Provider, MD  promethazine (PHENERGAN) 25 MG tablet Take 25 mg by mouth every 4 (four) hours as needed for nausea or vomiting.   Yes Historical Provider, MD  simvastatin (ZOCOR) 10 MG tablet Take 10 mg by mouth at bedtime.   Yes Historical Provider, MD  Skin Protectants, Misc. (EUCERIN) cream Apply 1 application topically 2 (two) times daily.    Yes Historical Provider, MD  vitamin E (VITAMIN E) 200 UNIT capsule Take 200 Units by mouth daily.   Yes Historical Provider, MD  Vitamins A & D (VITAMIN A & D) ointment Apply 1 application topically as needed for dry skin.   Yes Historical Provider, MD  zinc oxide 20 % ointment Apply 1 application topically 3 (three) times daily. And PRN   Yes Historical Provider, MD   BP 91/53 mmHg  Pulse 79  Temp(Src) 98.3 F (36.8 C) (Oral)  Resp 16  SpO2 93% Physical Exam  Constitutional: He appears well-developed and well-nourished.  HENT:  Head: Normocephalic and atraumatic.  Neck: Normal range of motion.  Cardiovascular: Normal rate.   Pulmonary/Chest: Effort normal. No respiratory distress. He has no wheezes.  Abdominal: Soft. He exhibits no distension. There is no tenderness.  Musculoskeletal: Normal range of motion. He exhibits no edema or tenderness.  Neurological: He is alert. No cranial nerve deficit.  Moves all extremities. Sensation in all extremities. CN's intact, appears to have facial droop but when smiles and sticks out tongue it is not apparent.   Skin: Skin is warm and dry.  Nursing note and vitals reviewed.   ED Course  Procedures (including critical care time)  CRITICAL CARE Performed by: Marily MemosMesner, Jodee Wagenaar Total  critical care time: 45 minutes Critical care time was exclusive of separately billable procedures and treating other patients. Critical care was necessary to treat or prevent imminent or life-threatening deterioration. Critical care was time spent personally by me on the following activities: development of treatment plan with patient and/or surrogate as well as nursing, discussions with consultants, evaluation of patient's response to treatment, examination of patient, obtaining history from patient or surrogate, ordering and performing treatments and interventions, ordering and review of laboratory studies, ordering and review of radiographic studies, pulse oximetry and re-evaluation of patient's condition.  Labs Review Labs Reviewed  CBC - Abnormal; Notable for the following:    WBC 15.1 (*)    All other components within normal limits  DIFFERENTIAL - Abnormal; Notable for the following:    Neutro Abs 14.1 (*)    Lymphs Abs 0.5 (*)    All other components within normal limits  COMPREHENSIVE METABOLIC PANEL - Abnormal; Notable for the following:  Glucose, Bld 143 (*)    Creatinine, Ser 1.29 (*)    Albumin 3.1 (*)    GFR calc non Af Amer 57 (*)    All other components within normal limits  CBG MONITORING, ED - Abnormal; Notable for the following:    Glucose-Capillary 103 (*)    All other components within normal limits  I-STAT CG4 LACTIC ACID, ED - Abnormal; Notable for the following:    Lactic Acid, Venous 2.19 (*)    All other components within normal limits  URINE CULTURE  CULTURE, BLOOD (ROUTINE X 2)  CULTURE, BLOOD (ROUTINE X 2)  GRAM STAIN  URINE CULTURE  TSH  URINALYSIS, ROUTINE W REFLEX MICROSCOPIC (NOT AT [facility])  LEGIONELLA PNEUMOPHILA SEROGP 1 UR AG  STREP PNEUMONIAE URINARY ANTIGEN  LACTIC ACID, PLASMA  LACTIC ACID, PLASMA  I-STAT TROPOININ, ED  I-STAT CG4 LACTIC ACID, ED    Imaging Review Dg Chest 2 View  08/19/2015  CLINICAL DATA:  65 year old with slurred  speech and left-sided weakness since yesterday. History of dementia and hypertension. EXAM: CHEST  2 VIEW COMPARISON:  10/16/2010 and 01/04/2008. FINDINGS: There are significantly lower lung volumes. The heart size and mediastinal contours are stable. There is increased vascular congestion with central airway thickening and asymmetric patchy perihilar airspace opacities on the right. There is no consolidation, pneumothorax or significant pleural effusion. The bones appear unchanged. IMPRESSION: Lower lung volumes with asymmetric patchy perihilar airspace opacities on the right suspicious for aspiration. Electronically Signed   By: Carey BullocksWilliam  Veazey M.D.   On: 08/19/2015 11:52   Ct Head Wo Contrast  08/19/2015  CLINICAL DATA:  Slurred speech, left-sided weakness. EXAM: CT HEAD WITHOUT CONTRAST TECHNIQUE: Contiguous axial images were obtained from the base of the skull through the vertex without intravenous contrast. COMPARISON:  12/10/2012 FINDINGS: There is atrophy and chronic small vessel disease changes. No acute intracranial abnormality. Specifically, no hemorrhage, hydrocephalus, mass lesion, acute infarction, or significant intracranial injury. No acute calvarial abnormality. Mucosal thickening in the ethmoid air cells. Mastoid air cells are clear. IMPRESSION: No acute intracranial abnormality. Atrophy, chronic microvascular disease. Chronic ethmoid sinusitis. Electronically Signed   By: Charlett NoseKevin  Dover M.D.   On: 08/19/2015 11:43   I have personally reviewed and evaluated these images and lab results as part of my medical decision-making.   EKG Interpretation   Date/Time:  Wednesday August 19 2015 11:19:14 EDT Ventricular Rate:  86 PR Interval:    QRS Duration: 78 QT Interval:  350 QTC Calculation: 419 R Axis:   19 Text Interpretation:  Sinus rhythm No significant change since last  tracing Confirmed by [facility] MD, Barbara CowerJASON [phone] on 08/19/2015 11:23:35 AM  Also confirmed by [facility], Sereniti Wan [phone]),  editor Stout CT, Jola BabinskiMarilyn  [phone])  on 08/19/2015 12:01:36 PM      MDM   Final diagnoses:  Transient alteration of awareness  Hypotension, unspecified hypotension type  Dysphagia  Aspiration pneumonitis [facility](HCC)   65 yo M w/ dementia here with weakness, hypotension. Slurred speech, difficulty walking, difficulty with ADL's all of which are new.  Hypotensive here, no focal deficits. Plan for stroke workup, infectious workup. Fluids administered. Doubt sepsis at this time. Doubt neurologic shock as well. Possibly related to dehydration. Either way, will likely will need neurologic eval and admission.  Patient's mental status improved to close to his baseline. Blood pressure stable.   Discussed patient's case with [facility] hospital[facility] team who recommend admission to observation, med-surg bed.  I will place holding orders per  their request. Patient and family (if present) updated with plan. Care transferred to medicine service.  I reviewed all nursing notes, vitals, pertinent old records, EKGs, labs, imaging (as available).  Marily Memos, MD 08/19/15 [phone]

## 2015-08-19 NOTE — ED Notes (Addendum)
Patient here from group home for slurred speech last night and left sided weakness and inability to stand this am without leaning. Staff member reports hypotension the past few days. This morning patient unable to control head and fell into food, does have dementia but normally performs all ADL's with minimal to no assistance.

## 2015-08-19 NOTE — H&P (Signed)
History and Physical    Jeremy Murillo NUU:[id number] DOB: [birth year] DOA: 08/19/2015   PCP: Lucretia Fieldoyals, Hoover M   Patient coming from:  [facility]   Chief Complaint: Confusion and Hypotension  HPI: Jeremy Murillo is a 65 y.o. male with medical history significant for Down's syndrome, GERD/Barret's with chronic dysphagia, hypothyroidism, cognitive deficiencies, brought to the ED  From his Group home for evaluation of increased confusion with slurred speech, generalized weakness favoring the left side, and inability to stand up without leaning. LAst seen normal last night. Sitter reports that patient was weak and had decreased oral intake 2-3 days prior to admission. This morning he fell into his food and it is unknown if he had aspirated.  Unknown if he had any sick contacts. Sitter denies patient had any respiratory or cardiac complaints prior to admission. He had not been afebrile. He has some issues with urinary incontinence, for which he uses a condom catheter. He also has chronic constipation. No bleeding issues have been reported. No tick or other insect bites. He was afebrile on presentation  Once  Receiving 1 l IVF, his overall mental status began to improve almost to to his baseline.    ED Course:  BP 87/55 mmHg  Pulse 82  Temp(Src) 98.3 F (36.8 C) (Oral)  Resp 16  SpO2 96%  BP was in the 70/50s at its lowest. Afebrile   CXR Lower lung volumes with asymmetric patchy perihilar airspace opacities on the right suspicious for aspirat CT head negative for acute intracranial abnormalities Lactic acid 2.19 early this morning, trending LA pending  WBC 15. 1.  EKG  On SR without ACS CBG 103 but runs in the lower values  Review of Systems: As per HPI otherwise 10 point review of systems negative.   Past Medical History  Diagnosis Date  . Down's syndrome   . Unspecified hemorrhoids without mention of complication   . Stricture and stenosis of esophagus   . Esophageal reflux     . Barrett's esophagus   . Dysphagia, unspecified(787.20)   . Diaphragmatic hernia without mention of obstruction or gangrene   . Unspecified hypothyroidism   . Dementia   . Hypothyroid   . Astigmatism     Past Surgical History  Procedure Laterality Date  . None      Social History Social History   Social History  . Marital Status: Single    Spouse Name: N/A  . Number of Children: 0  . Years of Education: N/A   Occupational History  . Disabled    Social History Main Topics  . Smoking status: Never Smoker   . Smokeless tobacco: Never Used  . Alcohol Use: No  . Drug Use: No  . Sexual Activity: No   Other Topics Concern  . Not on file   Social History Narrative   [facility].    Caffeine- very rare.   Right handed.     No Known Allergies  Family History  Problem Relation Age of Onset  . Colon cancer Neg Hx   . Esophageal cancer Neg Hx   . Rectal cancer Neg Hx   . Stomach cancer Neg Hx       Prior to Admission medications   Medication Sig Start Date End Date Taking? Authorizing Provider  acetaminophen (TYLENOL) 325 MG tablet Take by mouth every 4 (four) hours as needed.    Historical Provider, MD  alum & mag hydroxide-simeth (MAALOX/MYLANTA) 200-200-20 MG/5ML  suspension Take by mouth as needed for indigestion or heartburn.    Historical Provider, MD  bisacodyl (DULCOLAX) 10 MG suppository Place 10 mg rectally as needed for moderate constipation.    Historical Provider, MD  brompheniramine-pseudoephedrine (DIMETAPP) 1-15 MG/5ML ELIX Take by mouth as needed for allergies.    Historical Provider, MD  carbamide peroxide (DEBROX) 6.5 % otic solution 5 drops 2 (two) times daily.    Historical Provider, MD  coal tar (NEUTROGENA T-GEL) 0.5 % shampoo Apply topically at bedtime as needed.    Historical Provider, MD  diphenhydrAMINE (BENADRYL) 25 MG tablet Take 25 mg by mouth every 4 (four) hours as needed.    Historical Provider, MD   docusate sodium (COLACE) 100 MG capsule Take 100 mg by mouth at bedtime.    Historical Provider, MD  donepezil (ARICEPT) 5 MG tablet Take 1 tablet (5 mg total) by mouth daily. 11/17/14   Nilda Riggs, NP  esomeprazole (NEXIUM) 20 MG capsule Take 20 mg by mouth daily at 12 noon.    Historical Provider, MD  hydrocortisone (ANUSOL-HC) 25 MG suppository Place 25 mg rectally 3 (three) times daily as needed for hemorrhoids or itching.     Historical Provider, MD  levothyroxine (SYNTHROID, LEVOTHROID) 100 MCG tablet Take 100 mcg by mouth daily before breakfast.    Historical Provider, MD  loperamide (IMODIUM A-D) 2 MG tablet Take 2 mg by mouth as needed for diarrhea or loose stools.    Historical Provider, MD  magnesium hydroxide (MILK OF MAGNESIA) 400 MG/5ML suspension Take by mouth daily as needed for mild constipation.    Historical Provider, MD  memantine (NAMENDA XR) 28 MG CP24 24 hr capsule Take 1 capsule (28 mg total) by mouth daily. 11/17/14   Nilda Riggs, NP  metroNIDAZOLE (METROGEL) 1 % gel Apply 1 application topically daily.    Historical Provider, MD  promethazine (PHENERGAN) 25 MG tablet Take 25 mg by mouth every 4 (four) hours as needed for nausea or vomiting.    Historical Provider, MD  simvastatin (ZOCOR) 10 MG tablet Take 10 mg by mouth at bedtime.    Historical Provider, MD  Skin Protectants, Misc. (EUCERIN) cream Apply 1 application topically as needed for dry skin.     Historical Provider, MD  triamcinolone cream (KENALOG) 0.1 % Apply 1 application topically 2 (two) times daily.    Historical Provider, MD  vitamin E (VITAMIN E) 200 UNIT capsule Take 200 Units by mouth daily.    Historical Provider, MD  zinc oxide 20 % ointment Apply 1 application topically as needed for irritation.    Historical Provider, MD    Physical Exam:    Filed Vitals:   08/19/15 1158 08/19/15 1200 08/19/15 1245 08/19/15 1345  BP: 95/59 102/59 91/57 87/55   Pulse: 77 78 76 82  Temp:       TempSrc:      Resp: 17 18 16 16   SpO2: 97% 96% 97% 96%       Constitutional: NAD, calm, comfortable    Filed Vitals:   08/19/15 1158 08/19/15 1200 08/19/15 1245 08/19/15 1345  BP: 95/59 102/59 91/57 87/55   Pulse: 77 78 76 82  Temp:      TempSrc:      Resp: 17 18 16 16   SpO2: 97% 96% 97% 96%   Eyes: PERRL, lids and conjunctivae normal ENMT: Mucous membranes are moist. Posterior pharynx clear of any exudate or lesions.Normal dentition.  Neck: normal, supple, no masses, no thyromegaly Respiratory:  clear to auscultation bilaterally, no wheezing, mild crackles at bases . Normal respiratory effort. No accessory muscle use.  Cardiovascular: Regular rate and rhythm, no murmurs / rubs / gallops. No extremity edema. 2+ pedal pulses. No carotid bruits.  Abdomen: no tenderness, no masses palpated. No hepatosplenomegaly. Bowel sounds positive.  Musculoskeletal: no clubbing / cyanosis. No joint deformity upper and lower extremities. Good ROM, no contractures. Normal muscle tone.  Skin: no rashes, lesions, ulcers.  Neurologic: patient has cognitive deficiency, Down's syndrome, follows very simple  commands. Mood stable     Labs on Admission: I have personally reviewed following labs and imaging studies  CBC:  Recent Labs Lab 08/19/15 1039  WBC 15.1*  NEUTROABS 14.1*  HGB 13.2  HCT 40.4  MCV 92.9  PLT 199    Basic Metabolic Panel:  Recent Labs Lab 08/19/15 1039  NA 136  K 4.1  CL 103  CO2 25  GLUCOSE 143*  BUN 17  CREATININE 1.29*  CALCIUM 8.9    GFR: CrCl cannot be calculated (Unknown ideal weight.).  Liver Function Tests:  Recent Labs Lab 08/19/15 1039  AST 31  ALT 21  ALKPHOS 88  BILITOT 0.5  PROT 6.6  ALBUMIN 3.1*   No results for input(s): LIPASE, AMYLASE in the last 168 hours. No results for input(s): AMMONIA in the last 168 hours.  Coagulation Profile: No results for input(s): INR, PROTIME in the last 168 hours.  Cardiac Enzymes: No results  for input(s): CKTOTAL, CKMB, CKMBINDEX, TROPONINI in the last 168 hours.  BNP (last 3 results) No results for input(s): PROBNP in the last 8760 hours.  HbA1C: No results for input(s): HGBA1C in the last 72 hours.  CBG:  Recent Labs Lab 08/19/15 1202  GLUCAP 103*    Lipid Profile: No results for input(s): CHOL, HDL, LDLCALC, TRIG, CHOLHDL, LDLDIRECT in the last 72 hours.  Thyroid Function Tests:  Recent Labs  08/19/15 1039  TSH 1.618    Anemia Panel: No results for input(s): VITAMINB12, FOLATE, FERRITIN, TIBC, IRON, RETICCTPCT in the last 72 hours.  Urine analysis:    Component Value Date/Time   COLORURINE YELLOW 12/22/2009 1215   APPEARANCEUR CLEAR 12/22/2009 1215   LABSPEC 1.009 12/22/2009 1215   PHURINE 6.0 12/22/2009 1215   GLUCOSEU NEGATIVE 12/22/2009 1215   HGBUR NEGATIVE 12/22/2009 1215   BILIRUBINUR NEGATIVE 12/22/2009 1215   KETONESUR NEGATIVE 12/22/2009 1215   PROTEINUR NEGATIVE 12/22/2009 1215   UROBILINOGEN 0.2 12/22/2009 1215   NITRITE NEGATIVE 12/22/2009 1215   LEUKOCYTESUR  12/22/2009 1215    NEGATIVE MICROSCOPIC NOT DONE ON URINES WITH NEGATIVE PROTEIN, BLOOD, LEUKOCYTES, NITRITE, OR GLUCOSE <1000 mg/dL.    Sepsis Labs: @LABRCNTIP (procalcitonin:4,lacticidven:4) )No results found for this or any previous visit (from the past 240 hour(s)).   Radiological Exams on Admission: Dg Chest 2 View  08/19/2015  CLINICAL DATA:  65 year old with slurred speech and left-sided weakness since yesterday. History of dementia and hypertension. EXAM: CHEST  2 VIEW COMPARISON:  10/16/2010 and 01/04/2008. FINDINGS: There are significantly lower lung volumes. The heart size and mediastinal contours are stable. There is increased vascular congestion with central airway thickening and asymmetric patchy perihilar airspace opacities on the right. There is no consolidation, pneumothorax or significant pleural effusion. The bones appear unchanged. IMPRESSION: Lower lung  volumes with asymmetric patchy perihilar airspace opacities on the right suspicious for aspiration. Electronically Signed   By: Carey BullocksWilliam  Veazey M.D.   On: 08/19/2015 11:52   Ct Head Wo Contrast  08/19/2015  CLINICAL DATA:  Slurred speech, left-sided weakness. EXAM: CT HEAD WITHOUT CONTRAST TECHNIQUE: Contiguous axial images were obtained from the base of the skull through the vertex without intravenous contrast. COMPARISON:  12/10/2012 FINDINGS: There is atrophy and chronic small vessel disease changes. No acute intracranial abnormality. Specifically, no hemorrhage, hydrocephalus, mass lesion, acute infarction, or significant intracranial injury. No acute calvarial abnormality. Mucosal thickening in the ethmoid air cells. Mastoid air cells are clear. IMPRESSION: No acute intracranial abnormality. Atrophy, chronic microvascular disease. Chronic ethmoid sinusitis. Electronically Signed   By: Charlett Nose M.D.   On: 08/19/2015 11:43    EKG: Independently reviewed.  Assessment/Plan Principal Problem:   Hypotension Active Problems:   Hemorrhoids   ESOPHAGEAL STRICTURE   Diaphragmatic hernia   DOWNS SYNDROME   Dysphagia   Altered mental status     HCAP/aspiration: CXR concerning for R aspiration PNA/pneumonitis Lactic acid 2.19 early this morning, trending LA pending WBC 15. 1.  - sputum cultures, legionella Ag, strep Ag,  Urine and Blood cultures pending Unasyn per Pharmacy Trend Lactic Acid IVF  Due to history of chronic dysphagia, will obtain SLP/ nutrition consult to prevent further aspiration  Hypotension without syncope, with increased debilitation  Likely due to dehydration   BP in 70s/50s on arrival, now improving . Baseline BP around 90/60s;  Septic shock doubted, no fever reported.  CT head negative for acute intracranial abnormalities. Received IVF 1 L with improvement of symptoms.CBG 103 but runs in the lower values. EKG on SR without ACS Continue IVF at 100/h NS Monitor BP q 4 h  x3  For his cognitive deficiency, continue his home meds  Consult PT/OT  Down's syndrome, and acute confusional state : admitted with increased confusion as above, likely due to dehydration/infection. Per patient's caregiver he is returning to baseline Continue Aricept and Namenda   Hypothyroidism: -Continue home Synthroid   DVT prophylaxis: Lovenox   Code Status:   Full    Family Communication:  Discussed with patient sitter Disposition Plan: Expect patient to be discharged to Group home after condition improves Consults called:    Nutrition  Admission status: Obs Medsurg     Marlowe Kays E, PA-C [facility]   If 7PM-7AM, please contact night-coverage [email or web address] Password [facility]  08/19/2015, 2:06 PM

## 2015-08-19 NOTE — Progress Notes (Signed)
Pharmacy Antibiotic Note  Jeremy Jeremy Murillo Jeremy Murillo is a 65 y.o. male admitted on 08/19/2015 with aspiration PNA.  Pharmacy has been consulted for Unasyn dosing. AF, wbc 15.1, CrCl~41.  Plan: Unasyn 3g IV q8h Monitor clinical progress, c/s, renal function, abx plan/LOT     Temp (24hrs), Avg:98.3 F (36.8 C), Min:98.3 F (36.8 C), Max:98.3 F (36.8 C)   Recent Labs Lab 08/19/15 1039 08/19/15 1106  WBC 15.1*  --   CREATININE 1.29*  --   LATICACIDVEN  --  2.19*    CrCl cannot be calculated (Unknown ideal weight.).    No Known Allergies  Antimicrobials this admission: 6/28 Unasyn >>   Dose adjustments this admission:   Microbiology results: 6/28 BCx:  6/28 UCx:     Jeremy Jeremy Murillo, PharmD, BCPS Clinical Pharmacist Pager [phone] 08/19/2015 2:27 PM

## 2015-08-19 NOTE — Care Management Note (Signed)
Case Management Note  Patient Details  Name: Jeremy Murillo MRN: [id number] Date of Birth: [birth year]  Subjective/Objective:                  65 y.o. male with medical history significant for Down's syndrome, GERD/Barret's with chronic dysphagia, hypothyroidism, cognitive deficiencies, brought to the ED From his Group home for evaluation of increased confusion with slurred speech, generalized weakness favoring the left side, and inability to stand up without leaning.  Action/Plan: Follow for disposition needs.   Expected Discharge Date:  08/20/15               Expected Discharge Plan:  Group Home  In-House Referral:  Clinical Social Work  Discharge planning Services  CM Consult  Post Acute Care Choice:    Choice offered to:     DME Arranged:    DME Agency:     HH Arranged:    HH Agency:     Status of Service:  In process, will continue to follow  If discussed at Long Length of Stay Meetings, dates discussed:    Additional Comments:  Oletta CohnWood, Afrah Burlison, RN 08/19/2015, 2:37 PM

## 2015-08-19 NOTE — ED Notes (Signed)
Patient transported to CT 

## 2015-08-19 NOTE — ED Notes (Signed)
Pt CBG, 103. Nurse was notified.

## 2015-08-20 ENCOUNTER — Observation Stay (HOSPITAL_COMMUNITY): Payer: Commercial Managed Care - HMO

## 2015-08-20 DIAGNOSIS — I9589 Other hypotension: Secondary | ICD-10-CM | POA: Diagnosis not present

## 2015-08-20 DIAGNOSIS — A419 Sepsis, unspecified organism: Secondary | ICD-10-CM | POA: Diagnosis present

## 2015-08-20 DIAGNOSIS — Q909 Down syndrome, unspecified: Secondary | ICD-10-CM | POA: Diagnosis not present

## 2015-08-20 DIAGNOSIS — F0390 Unspecified dementia without behavioral disturbance: Secondary | ICD-10-CM | POA: Diagnosis present

## 2015-08-20 DIAGNOSIS — R32 Unspecified urinary incontinence: Secondary | ICD-10-CM | POA: Diagnosis present

## 2015-08-20 DIAGNOSIS — K449 Diaphragmatic hernia without obstruction or gangrene: Secondary | ICD-10-CM | POA: Diagnosis present

## 2015-08-20 DIAGNOSIS — J690 Pneumonitis due to inhalation of food and vomit: Secondary | ICD-10-CM | POA: Diagnosis present

## 2015-08-20 DIAGNOSIS — R531 Weakness: Secondary | ICD-10-CM | POA: Diagnosis present

## 2015-08-20 DIAGNOSIS — I10 Essential (primary) hypertension: Secondary | ICD-10-CM | POA: Diagnosis present

## 2015-08-20 DIAGNOSIS — K222 Esophageal obstruction: Secondary | ICD-10-CM | POA: Diagnosis not present

## 2015-08-20 DIAGNOSIS — B9620 Unspecified Escherichia coli [E. coli] as the cause of diseases classified elsewhere: Secondary | ICD-10-CM | POA: Diagnosis present

## 2015-08-20 DIAGNOSIS — K5909 Other constipation: Secondary | ICD-10-CM | POA: Diagnosis present

## 2015-08-20 DIAGNOSIS — G9341 Metabolic encephalopathy: Secondary | ICD-10-CM | POA: Diagnosis present

## 2015-08-20 DIAGNOSIS — K649 Unspecified hemorrhoids: Secondary | ICD-10-CM | POA: Diagnosis present

## 2015-08-20 DIAGNOSIS — D649 Anemia, unspecified: Secondary | ICD-10-CM | POA: Diagnosis present

## 2015-08-20 DIAGNOSIS — E860 Dehydration: Secondary | ICD-10-CM | POA: Diagnosis present

## 2015-08-20 DIAGNOSIS — E039 Hypothyroidism, unspecified: Secondary | ICD-10-CM | POA: Diagnosis present

## 2015-08-20 DIAGNOSIS — K219 Gastro-esophageal reflux disease without esophagitis: Secondary | ICD-10-CM | POA: Diagnosis present

## 2015-08-20 DIAGNOSIS — Z8701 Personal history of pneumonia (recurrent): Secondary | ICD-10-CM | POA: Diagnosis not present

## 2015-08-20 DIAGNOSIS — N390 Urinary tract infection, site not specified: Secondary | ICD-10-CM | POA: Diagnosis present

## 2015-08-20 DIAGNOSIS — R1310 Dysphagia, unspecified: Secondary | ICD-10-CM | POA: Diagnosis not present

## 2015-08-20 LAB — CBC
HCT: 32 % — ABNORMAL LOW (ref 39.0–52.0)
Hemoglobin: 10.7 g/dL — ABNORMAL LOW (ref 13.0–17.0)
MCH: 31.9 pg (ref 26.0–34.0)
MCHC: 33.4 g/dL (ref 30.0–36.0)
MCV: 95.5 fL (ref 78.0–100.0)
PLATELETS: 162 10*3/uL (ref 150–400)
RBC: 3.35 MIL/uL — AB (ref 4.22–5.81)
RDW: 15.8 % — ABNORMAL HIGH (ref 11.5–15.5)
WBC: 6.6 10*3/uL (ref 4.0–10.5)

## 2015-08-20 LAB — COMPREHENSIVE METABOLIC PANEL
ALK PHOS: 63 U/L (ref 38–126)
ALT: 16 U/L — AB (ref 17–63)
AST: 24 U/L (ref 15–41)
Albumin: 2.2 g/dL — ABNORMAL LOW (ref 3.5–5.0)
Anion gap: 8 (ref 5–15)
BUN: 12 mg/dL (ref 6–20)
CALCIUM: 7.9 mg/dL — AB (ref 8.9–10.3)
CO2: 21 mmol/L — ABNORMAL LOW (ref 22–32)
CREATININE: 1.09 mg/dL (ref 0.61–1.24)
Chloride: 112 mmol/L — ABNORMAL HIGH (ref 101–111)
Glucose, Bld: 83 mg/dL (ref 65–99)
Potassium: 3.8 mmol/L (ref 3.5–5.1)
Sodium: 141 mmol/L (ref 135–145)
Total Bilirubin: 0.5 mg/dL (ref 0.3–1.2)
Total Protein: 5 g/dL — ABNORMAL LOW (ref 6.5–8.1)

## 2015-08-20 MED ORDER — ENSURE ENLIVE PO LIQD
237.0000 mL | Freq: Three times a day (TID) | ORAL | Status: DC
  Administered 2015-08-20 – 2015-08-21 (×3): 237 mL via ORAL

## 2015-08-20 MED ORDER — SODIUM CHLORIDE 0.9 % IV BOLUS (SEPSIS)
250.0000 mL | Freq: Once | INTRAVENOUS | Status: AC
  Administered 2015-08-20: 250 mL via INTRAVENOUS

## 2015-08-20 NOTE — Consult Note (Signed)
[facility] Consult: 11:52 AM 08/20/2015     Referring Provider: Dr Roda ShuttersXu  Primary Care Physician:  Jeremy Murillo Primary Gastroenterologist:  Dr. Christella HartiganJacobs     Reason for Consultation:  Dysphagia, hx esophageal stricture.    HPI: Jeremy Murillo is a 65 y.o. male.  Has Down's syndrome.  Hypothyroidism.  Hemorrhoids.  Chronic constipation.  Urinary incontinence.  Hx Barrett's esophagus, esophageal strictures at GE jx, dysphagia.  S/p multiple EGDs and stricture dilations (2009, 2011 x 4, 2012, 2014)  .   Latest EGD/Dilation was 05/2012: dilated distal stricture with 18 mm CRE TTS baloon for 1 minute.   Admitted yesterday from group home.  + confusion, slurred speech, weakness.  3 days diminished po intake. In AM of DOA, while eating, fell forward into plate of food, some concern he aspirated at the time.   BP hypotensive 70s/50s, no fever.  sats 96%.   CXRs with right opacities, concerning for aspiration.  Todays CXR shows improving pattern.  WBCs 15.1, today 6.6 after doses of  Hgb 13.2 (c/w 13.4 on 08/2014) 10.7 today  Renal function not compromised.   SLP bedside swallow study today: no sign of aspiration with any texture. Swallow function with liquids appears WNL. However, mastication of solids lacks rotary motion and timely bolus formation; pt munches on solids without initiating a swallow prior to more intake. This is certainly his baseline and likely poses little threat, though could be complicated if a larger solid was unmasticated and could not traverse esophagus given history of stricture.  Diet rec: thin liquid, puree with meds.  Suggest GI consult.    Clinically he is improved.  Fully alert, fidgeting and has managed to pull out his IV.  He is chewing at sheetsk, armband.         Past Medical  History  Diagnosis Date  . Down's syndrome   . Unspecified hemorrhoids without mention of complication   . Stricture and stenosis of esophagus   . Esophageal reflux   . Barrett's esophagus   . Dysphagia, unspecified(787.20)   . Diaphragmatic hernia without mention of obstruction or gangrene   . Unspecified hypothyroidism   . Dementia   . Hypothyroid   . Astigmatism     Past Surgical History  Procedure Laterality Date  . None      Prior to Admission medications   Medication Sig Start Date End Date Taking? Authorizing Provider  acetaminophen (TYLENOL) 325 MG tablet Take 650 mg by mouth every 4 (four) hours as needed for mild pain or fever.    Yes Historical Provider, MD  alum & mag hydroxide-simeth (MAALOX/MYLANTA) 200-200-20 MG/5ML suspension Take by mouth as needed for indigestion or heartburn.   Yes Historical Provider, MD  bisacodyl (DULCOLAX) 10 MG suppository Place 10 mg rectally as needed for moderate constipation.   Yes Historical Provider, MD  brompheniramine-pseudoephedrine (DIMETAPP) 1-15 MG/5ML ELIX Take by mouth as needed for allergies.   Yes Historical Provider, MD  carbamide peroxide (DEBROX) 6.5 % otic solution 5 drops 2 (two) times daily.   Yes  Historical Provider, MD  coal tar (NEUTROGENA T-GEL) 0.5 % shampoo Apply 1 application topically every evening.    Yes Historical Provider, MD  Diethyltoluamide (OFF DEEP WOODS DRY) AERO Apply 1 application topically as needed.   Yes Historical Provider, MD  diphenhydrAMINE (BENADRYL) 25 MG tablet Take 25 mg by mouth every 4 (four) hours as needed.   Yes Historical Provider, MD  docusate sodium (COLACE) 100 MG capsule Take 100 mg by mouth at bedtime.   Yes Historical Provider, MD  donepezil (ARICEPT) 5 MG tablet Take 1 tablet (5 mg total) by mouth daily. 11/17/14  Yes Nilda RiggsNancy Carolyn Martin, NP  esomeprazole (NEXIUM) 20 MG capsule Take 20 mg by mouth 2 (two) times daily before a meal.    Yes Historical Provider, MD  hydrocortisone  (ANUSOL-HC) 25 MG suppository Place 25 mg rectally 3 (three) times daily as needed for hemorrhoids or itching.    Yes Historical Provider, MD  levothyroxine (SYNTHROID, LEVOTHROID) 100 MCG tablet Take 100 mcg by mouth daily before breakfast.   Yes Historical Provider, MD  lip balm (CARMEX) ointment Apply 1 application topically as needed for lip care.   Yes Historical Provider, MD  loperamide (IMODIUM A-D) 2 MG tablet Take 4 mg by mouth as needed for diarrhea or loose stools.    Yes Historical Provider, MD  magnesium hydroxide (MILK OF MAGNESIA) 400 MG/5ML suspension Take by mouth daily as needed for mild constipation.   Yes Historical Provider, MD  memantine (NAMENDA XR) 28 MG CP24 24 hr capsule Take 1 capsule (28 mg total) by mouth daily. 11/17/14  Yes Nilda RiggsNancy Carolyn Martin, NP  metroNIDAZOLE (METROGEL) 1 % gel Apply 1 application topically daily.   Yes Historical Provider, MD  neomycin-bacitracin-polymyxin (NEOSPORIN) ointment Apply 1 application topically as needed for wound care. apply to eye   Yes Historical Provider, MD  promethazine (PHENERGAN) 25 MG tablet Take 25 mg by mouth every 4 (four) hours as needed for nausea or vomiting.   Yes Historical Provider, MD  simvastatin (ZOCOR) 10 MG tablet Take 10 mg by mouth at bedtime.   Yes Historical Provider, MD  Skin Protectants, Misc. (EUCERIN) cream Apply 1 application topically 2 (two) times daily.    Yes Historical Provider, MD  vitamin E (VITAMIN E) 200 UNIT capsule Take 200 Units by mouth daily.   Yes Historical Provider, MD  Vitamins A & D (VITAMIN A & D) ointment Apply 1 application topically as needed for dry skin.   Yes Historical Provider, MD  zinc oxide 20 % ointment Apply 1 application topically 3 (three) times daily. And PRN   Yes Historical Provider, MD    Scheduled Meds: . ampicillin-sulbactam (UNASYN) IV  3 g Intravenous Q8H  . donepezil  5 mg Oral Daily  . enoxaparin (LOVENOX) injection  40 mg Subcutaneous Q24H  . levothyroxine   100 mcg Oral QAC breakfast  . memantine  28 mg Oral Daily  . metroNIDAZOLE  1 application Topical Daily  . simvastatin  10 mg Oral QHS   Infusions: . sodium chloride 100 mL/hr at 08/20/15 0527   PRN Meds: bisacodyl, hydrocortisone, promethazine   Allergies as of 08/19/2015  . (No Known Allergies)    Family History  Problem Relation Age of Onset  . Colon cancer Neg Hx   . Esophageal cancer Neg Hx   . Rectal cancer Neg Hx   . Stomach cancer Neg Hx     Social History   Social History  . Marital Status: Single  Spouse Name: N/A  . Number of Children: 0  . Years of Education: N/A   Occupational History  . Disabled    Social History Main Topics  . Smoking status: Never Smoker   . Smokeless tobacco: Never Used  . Alcohol Use: No  . Drug Use: No  . Sexual Activity: No   Other Topics Concern  . Not on file   Social History Narrative   Patient lives in a group [facility].    Caffeine- very rare.   Right handed.    REVIEW OF SYSTEMS: Pt's speech is difficult to understand, seems unrelated to questions being asked, unable to obtain a ROS  PHYSICAL EXAM: Vital signs in last 24 hours: Filed Vitals:   08/20/15 0102 08/20/15 0510  BP: 82/46 92/43  Pulse: 75 70  Temp:  98.3 F (36.8 C)  Resp:  16   Wt Readings from Last 3 Encounters:  08/19/15 52.164 kg (115 lb)  01/13/15 50.803 kg (112 lb)  11/17/14 55.248 kg (121 lb 12.8 oz)   General: pleasant, non ill and non toxic older WM, looks younger than stated age Head:  No asymmetry, trauma or swelling.  Faces c/w Down's  Eyes:  No icterus or pallor.   Ears:  Not HOH  Nose:  No discharge Mouth:  Clear, moist oral MM.  Tongue midline.  Neck:  No mass, no TMG, no JVD Lungs:  Clear bil.  No cough or labored breathing Heart: RRR.  No MRG.  S1/S2 present.  Abdomen:  Soft, NT, ND.  No mass, bruits, hernias, HSM.Marland Kitchen   Rectal: deferred   Musc/Skeltl: no joint contractures, redness or swelling Extremities:  No  CCE  Neurologic:  Alert, inconsistently following commands with repeated prompting.  Talkative but unable to make out what he is saying.  Fidgeting, moves all 4s Skin:  No rash or sores    Intake/Output from previous day: 06/28 0701 - 06/29 0700 In: 328.3 [I.V.:228.3; IV Piggyback:100] Out: -  Intake/Output this shift:    LAB RESULTS:  Recent Labs  08/19/15 1039 08/20/15 0553  WBC 15.1* 6.6  HGB 13.2 10.7*  HCT 40.4 32.0*  PLT 199 162   BMET Lab Results  Component Value Date   NA 141 08/20/2015   NA 136 08/19/2015   NA 137 09/15/2014   K 3.8 08/20/2015   K 4.1 08/19/2015   K 3.9 09/15/2014   CL 112* 08/20/2015   CL 103 08/19/2015   CL 105 09/15/2014   CO2 21* 08/20/2015   CO2 25 08/19/2015   CO2 28 09/15/2014   GLUCOSE 83 08/20/2015   GLUCOSE 143* 08/19/2015   GLUCOSE 107* 09/15/2014   BUN 12 08/20/2015   BUN 17 08/19/2015   BUN 16 09/15/2014   CREATININE 1.09 08/20/2015   CREATININE 1.29* 08/19/2015   CREATININE 1.15 09/15/2014   CALCIUM 7.9* 08/20/2015   CALCIUM 8.9 08/19/2015   CALCIUM 8.4* 09/15/2014   LFT  Recent Labs  08/19/15 1039 08/20/15 0553  PROT 6.6 5.0*  ALBUMIN 3.1* 2.2*  AST 31 24  ALT 21 16*  ALKPHOS 88 63  BILITOT 0.5 0.5   PT/INR No results found for: INR, PROTIME Hepatitis Panel No results for input(s): HEPBSAG, HCVAB, HEPAIGM, HEPBIGM in the last 72 hours. C-Diff No components found for: CDIFF Lipase     Component Value Date/Time   LIPASE 50 12/22/2009 1125    Drugs of Abuse  No results found for: LABOPIA, COCAINSCRNUR, LABBENZ, AMPHETMU, THCU, LABBARB  RADIOLOGY STUDIES: Dg Chest 2 View  08/20/2015  CLINICAL DATA:  History of aspiration pneumonia with weakness EXAM: CHEST  2 VIEW COMPARISON:  08/19/2015 FINDINGS: Cardiac shadow is stable. Some mild interval clearing is noted in the right lung base when compare with the prior exam. No new focal infiltrate is seen. No acute bony abnormality is noted. Small right  pleural effusion remains. IMPRESSION: Interval improved aeration in the right lung base. Electronically Signed   By: Alcide Clever Murillo.D.   On: 08/20/2015 07:57   Dg Chest 2 View  08/19/2015  CLINICAL DATA:  65 year old with slurred speech and left-sided weakness since yesterday. History of dementia and hypertension. EXAM: CHEST  2 VIEW COMPARISON:  10/16/2010 and 01/04/2008. FINDINGS: There are significantly lower lung volumes. The heart size and mediastinal contours are stable. There is increased vascular congestion with central airway thickening and asymmetric patchy perihilar airspace opacities on the right. There is no consolidation, pneumothorax or significant pleural effusion. The bones appear unchanged. IMPRESSION: Lower lung volumes with asymmetric patchy perihilar airspace opacities on the right suspicious for aspiration. Electronically Signed   By: Carey Bullocks Murillo.D.   On: 08/19/2015 11:52   Ct Head Wo Contrast  08/19/2015  CLINICAL DATA:  Slurred speech, left-sided weakness. EXAM: CT HEAD WITHOUT CONTRAST TECHNIQUE: Contiguous axial images were obtained from the base of the skull through the vertex without intravenous contrast. COMPARISON:  12/10/2012 FINDINGS: There is atrophy and chronic small vessel disease changes. No acute intracranial abnormality. Specifically, no hemorrhage, hydrocephalus, mass lesion, acute infarction, or significant intracranial injury. No acute calvarial abnormality. Mucosal thickening in the ethmoid air cells. Mastoid air cells are clear. IMPRESSION: No acute intracranial abnormality. Atrophy, chronic microvascular disease. Chronic ethmoid sinusitis. Electronically Signed   By: Charlett Nose Murillo.D.   On: 08/19/2015 11:43   Dg Chest Port 1 View  08/19/2015  CLINICAL DATA:  Bloody sputum.  Aspiration pneumonia. EXAM: PORTABLE CHEST 1 VIEW COMPARISON:  Earlier today FINDINGS: Pneumonia on the right is stable from prior. There is no visible cavitation. Trace right pleural  effusion. Skin fold on the right. No evidence of pneumothorax. Normal heart size and mediastinal contours. IMPRESSION: Stable right-sided aspiration/pneumonia. Trace right pleural effusion. Electronically Signed   By: Marnee Spring Murillo.D.   On: 08/19/2015 20:05    ENDOSCOPIC STUDIES: Per HPI  IMPRESSION:   *  Long-standing hx of GE jx strictures requiring dilations.  Not clear if taking Nexium bid.    *  Right sided PNA in pt from group home, pattern concerning for aspiration.  WBCs improved, day 2 Unasyn.     *  normocytic anemia post IVF.        PLAN:     *  Set pt up for EGD with dilatation for 6/30 at 10 AM.  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    *  Full liquid diet, npo after midnight.     Jennye Moccasin  08/20/2015, 11:52 AM Pager: [phone]

## 2015-08-20 NOTE — Progress Notes (Signed)
Initial Nutrition Assessment  DOCUMENTATION CODES:   Not applicable  INTERVENTION:   -Ensure Enlive po TID, each supplement provides 350 kcal and 20 grams of protein  NUTRITION DIAGNOSIS:   Predicted suboptimal nutrient intake related to dysphagia, lethargy/confusion as evidenced by per patient/family report.  GOAL:   Patient will meet greater than or equal to 90% of their needs  MONITOR:   PO intake, Supplement acceptance, Diet advancement, Labs, Weight trends, Skin, I & O's  REASON FOR ASSESSMENT:   Consult  (hx of dysphagia)  ASSESSMENT:   Jeremy Murillo is a 65 y.o. male with a Past Medical History of Down syndrome, esophageal stricture/Barrett's esophagus, dysphagia, dementia, hypothyroidism who presents with likely aspiration pneumonia and transient confusional state. Pt w/ a h/o esophageal stricture and may benefit from GI consultation. Will await SLP recs.   Pt admitted with likely aspiration pneumonia and transient confusional state. He resides at a group home.   Pt unable to provide hx. Speech was unintelligible; reports to this RD "I need my socks". He was throwing bed sheets at time of visit. Pt unable to answer questions or follow most commands.   Per chart review, pt with poor oral intake over the past 3 days. He has a hx of dysphagia and esophageal stricture. Unsure of diet PTA. SLP completed BSE; recommending thin liquid diet currently.   Pt will undergo EGD with dilation 08/21/15.   Nutrition-Focused physical exam completed. Findings are mild fat depletion, no muscle depletion, and no edema.   Labs reviewed.   Diet Order:  Diet NPO time specified Diet full liquid Room service appropriate?: Yes; Fluid consistency:: Thin  Skin:  Wound (see comment) (MASD buttocks)  Last BM:  PTA  Height:   Ht Readings from Last 1 Encounters:  08/19/15 4\' 9"  (1.448 m)    Weight:   Wt Readings from Last 1 Encounters:  08/19/15 115 lb (52.164 kg)    Ideal  Body Weight:     BMI:  Body mass index is 24.88 kg/(m^2).  Estimated Nutritional Needs:   Kcal:  1400-1600  Protein:  55-65 grams  Fluid:  1.4-1.6 L  EDUCATION NEEDS:   No education needs identified at this time  Jeremy Murillo, RD, LDN, CDE Pager: [phone] After hours Pager: [phone]

## 2015-08-20 NOTE — Progress Notes (Signed)
   08/20/15 0914  SLP G-Codes **NOT FOR INPATIENT CLASS**  Functional Assessment Tool Used (clinical judgement)  Functional Limitations Swallowing  Swallow Current Status (V4098(G8996) CI  Swallow Goal Status (J1914(G8997) CI  SLP Evaluations  $ SLP Speech Visit 1 Procedure  SLP Evaluations  $BSS Swallow 1 Procedure

## 2015-08-20 NOTE — Consult Note (Addendum)
WOC wound consult note Reason for Consult: Consult requested for bilat buttocks and inner gluteal fold.  Appearance consistent with moisture associated skin damage and patchy areas of partial thickness skin loss. Wound type: Inner gluteal fold with approx 7X1X.1cm fissure, red and moist wound bed related to moisture, surrounded by red macerated skin.  Bilat buttocks with multiple round, red partial thickness lesions in patchy areas, surrounded by red macular papular rash which may be related to candidiasis. Measurement: Affected bilat buttocks and inner gluteal fold involved areas approx 10X10cm Dressing procedure/placement/frequency: Air mattress to increase airflow and decrease pressure. Pt currently has a condom cath to contain urine. Barrier cream to protect and repel moisture, and antifungal powder.  No family members present to discuss plan of care and patient does not appear to understand. Please re-consult if further assistance is needed.  Thank-you,  Cammie Mcgeeawn Young Mulvey MSN, RN, CWOCN, [place]AP, CNS [phone]

## 2015-08-20 NOTE — Progress Notes (Signed)
PROGRESS NOTE  Jeremy Murillo WJX:[id number] DOB: 08-11-1950 DOA: 08/19/2015 PCP: Lucretia Fieldoyals, [place] M  HPI/Recap of past 24 hours:  Not following command, not communicating, does not seem in distress Last fever 9pm on 6/28, bp better  Assessment/Plan: Principal Problem:   Hypotension Active Problems:   Hemorrhoids   ESOPHAGEAL STRICTURE   Diaphragmatic hernia   DOWNS SYNDROME   Dysphagia   Altered mental status   HCAP/aspiration/sepsis presented on admission:  CXR concerning for R aspiration PNA/pneumonitis Lactic acid 2.19 early this morning, trending LA pending WBC 15. 1. With fever, hypotension - sputum cultures, legionella Ag, strep Ag, Urine and Blood cultures pending Unasyn per Pharmacy Trend Lactic Acid IVF  Due to history of chronic dysphagia, SLP/ nutrition consult,  Aspiration precuation  H/o esophageal stricture: GI consulted  Hypotension without syncope, with increased debilitation Likely due to dehydration  BP in 70s/50s on arrival, now improving . Baseline BP around 90/60s; Septic shock doubted, no fever reported. CT head negative for acute intracranial abnormalities. Received IVF 1 L with improvement of symptoms.CBG 103 but runs in the lower values. EKG on SR without ACS Continue IVF at 100/h NS   For his cognitive deficiency, continue his home meds  Consult PT/OT  Down's syndrome, and acute confusional state : admitted with increased confusion as above, likely due to dehydration/infection. Per patient's caregiver he is returning to baseline Continue Aricept and Namenda   Hypothyroidism: -Continue home Synthroid   DVT prophylaxis: Lovenox  Code Status: Full  Family Communication: Discussed with patient sitter Disposition Plan: Expect patient to be discharged to Group home after condition improves Consults called: Nutrition , GI   Procedures:  none  Antibiotics:  unasyn   Objective: BP 99/51 mmHg  Pulse 62  Temp(Src) 98.2  F (36.8 C) (Oral)  Resp 18  Ht 4\' 9"  (1.448 m)  Wt 52.164 kg (115 lb)  BMI 24.88 kg/m2  SpO2 98%  Intake/Output Summary (Last 24 hours) at 08/20/15 1917 Last data filed at 08/20/15 1607  Gross per 24 hour  Intake      0 ml  Output    525 ml  Net   -525 ml   Filed Weights   08/19/15 1532  Weight: 52.164 kg (115 lb)    Exam:   General:  NAD, not following commands, not communicating  Cardiovascular: RRR  Respiratory: CTABL  Abdomen: Soft/ND/NT, positive BS  Musculoskeletal: No Edema  Neuro: alert, seems at baseline  Data Reviewed: Basic Metabolic Panel:  Recent Labs Lab 08/19/15 1039 08/20/15 0553  NA 136 141  K 4.1 3.8  CL 103 112*  CO2 25 21*  GLUCOSE 143* 83  BUN 17 12  CREATININE 1.29* 1.09  CALCIUM 8.9 7.9*   Liver Function Tests:  Recent Labs Lab 08/19/15 1039 08/20/15 0553  AST 31 24  ALT 21 16*  ALKPHOS 88 63  BILITOT 0.5 0.5  PROT 6.6 5.0*  ALBUMIN 3.1* 2.2*   No results for input(s): LIPASE, AMYLASE in the last 168 hours. No results for input(s): AMMONIA in the last 168 hours. CBC:  Recent Labs Lab 08/19/15 1039 08/20/15 0553  WBC 15.1* 6.6  NEUTROABS 14.1*  --   HGB 13.2 10.7*  HCT 40.4 32.0*  MCV 92.9 95.5  PLT 199 162   Cardiac Enzymes:   No results for input(s): CKTOTAL, CKMB, CKMBINDEX, TROPONINI in the last 168 hours. BNP (last 3 results) No results for input(s): BNP in the last 8760 hours.  ProBNP (last 3 results)  No results for input(s): PROBNP in the last 8760 hours.  CBG:  Recent Labs Lab 08/19/15 1202  GLUCAP 103*    Recent Results (from the past 240 hour(s))  Blood culture (routine x 2)     Status: None (Preliminary result)   Collection Time: 08/19/15 11:10 AM  Result Value Ref Range Status   Specimen Description BLOOD RIGHT ANTECUBITAL  Final   Special Requests BOTTLES DRAWN AEROBIC AND ANAEROBIC 4CC  Final   Culture NO GROWTH < 24 HOURS  Final   Report Status PENDING  Incomplete  Blood  culture (routine x 2)     Status: None (Preliminary result)   Collection Time: 08/19/15 11:19 AM  Result Value Ref Range Status   Specimen Description BLOOD LEFT ANTECUBITAL  Final   Special Requests BOTTLES DRAWN AEROBIC ONLY 5CC  Final   Culture NO GROWTH < 24 HOURS  Final   Report Status PENDING  Incomplete  MRSA PCR Screening     Status: None   Collection Time: 08/19/15  6:14 PM  Result Value Ref Range Status   MRSA by PCR NEGATIVE NEGATIVE Final    Comment:        The GeneXpert MRSA Assay (FDA approved for NASAL specimens only), is one component of a comprehensive MRSA colonization surveillance program. It is not intended to diagnose MRSA infection nor to guide or monitor treatment for MRSA infections.      Studies: Dg Chest 2 View  08/20/2015  CLINICAL DATA:  History of aspiration pneumonia with weakness EXAM: CHEST  2 VIEW COMPARISON:  08/19/2015 FINDINGS: Cardiac shadow is stable. Some mild interval clearing is noted in the right lung base when compare with the prior exam. No new focal infiltrate is seen. No acute bony abnormality is noted. Small right pleural effusion remains. IMPRESSION: Interval improved aeration in the right lung base. Electronically Signed   By: Alcide CleverMark  Lukens M.D.   On: 08/20/2015 07:57   Dg Chest Port 1 View  08/19/2015  CLINICAL DATA:  Bloody sputum.  Aspiration pneumonia. EXAM: PORTABLE CHEST 1 VIEW COMPARISON:  Earlier today FINDINGS: Pneumonia on the right is stable from prior. There is no visible cavitation. Trace right pleural effusion. Skin fold on the right. No evidence of pneumothorax. Normal heart size and mediastinal contours. IMPRESSION: Stable right-sided aspiration/pneumonia. Trace right pleural effusion. Electronically Signed   By: Marnee SpringJonathon  Watts M.D.   On: 08/19/2015 20:05    Scheduled Meds: . ampicillin-sulbactam (UNASYN) IV  3 g Intravenous Q8H  . donepezil  5 mg Oral Daily  . enoxaparin (LOVENOX) injection  40 mg Subcutaneous Q24H    . feeding supplement (ENSURE ENLIVE)  237 mL Oral TID BM  . levothyroxine  100 mcg Oral QAC breakfast  . memantine  28 mg Oral Daily  . metroNIDAZOLE  1 application Topical Daily  . simvastatin  10 mg Oral QHS    Continuous Infusions: . sodium chloride 100 mL/hr at 08/20/15 [id number]     Time spent: 35mins  Quentin Strebel MD, PhD  [facility] Pager [phone]. If 7PM-7AM, please contact night-coverage at [email or web address], password [email or web address] 08/20/2015, 7:17 PM  LOS: 0 days

## 2015-08-20 NOTE — Evaluation (Signed)
Clinical/Bedside Swallow Evaluation Patient Details  Name: Jeremy Murillo MRN: [id number] Date of Birth: 11/22/1950  Today's Date: 08/20/2015 Time: SLP Start Time (ACUTE ONLY): 0948 SLP Stop Time (ACUTE ONLY): 1005 SLP Time Calculation (min) (ACUTE ONLY): 17 min  Past Medical History:  Past Medical History  Diagnosis Date  . Down's syndrome   . Unspecified hemorrhoids without mention of complication   . Stricture and stenosis of esophagus   . Esophageal reflux   . Barrett's esophagus   . Dysphagia, unspecified(787.20)   . Diaphragmatic hernia without mention of obstruction or gangrene   . Unspecified hypothyroidism   . Dementia   . Hypothyroid   . Astigmatism    Past Surgical History:  Past Surgical History  Procedure Laterality Date  . None     HPI:  Jeremy NumbersSteven Lechtenberg is a 65 y.o. male with a Past Medical History of Down syndrome, esophageal stricture/Barrett's esophagus, dysphagia, dementia, hypothyroidism who presents with likely aspiration pneumonia and transient confusional state. CXR states There is increased vascular   Assessment / Plan / Recommendation Clinical Impression  Pt demonstrates no sign of aspiration with any texture. Swallow function with liquids appears WNL. However, mastication of solids lacks rotary motion and timely bolus formation; pt munches on solids without initiating a swallow prior to more intake. This is certainly his baseline and likely poses little threat, though could be complicated if a larger solid was unmasticated and could not traverse esophagus given history of stricture. Last dilation was 2014 per chart. Pt may initiate a full liquid diet when ready, though will not write a diet order today as pt will likely have referral to GI. Will defer diet initaition to that service. Discussed with primary MD.     Aspiration Risk  Moderate aspiration risk    Diet Recommendation Thin liquid   Liquid Administration via: Cup;Straw Medication  Administration: Crushed with puree Supervision: Patient able to self feed Compensations: Slow rate;Small sips/bites Postural Changes: Seated upright at 90 degrees    Other  Recommendations Oral Care Recommendations: Oral care BID Other Recommendations: Have oral suction available   Follow up Recommendations  None    Frequency and Duration min 2x/week  2 weeks       Prognosis Prognosis for Safe Diet Advancement: Good      Swallow Study   General HPI: Jeremy NumbersSteven Zuccaro is a 65 y.o. male with a Past Medical History of Down syndrome, esophageal stricture/Barrett's esophagus, dysphagia, dementia, hypothyroidism who presents with likely aspiration pneumonia and transient confusional state. CXR states There is increased vascular Type of Study: Bedside Swallow Evaluation Previous Swallow Assessment: See history Diet Prior to this Study: NPO Temperature Spikes Noted: No History of Recent Intubation: No Behavior/Cognition: Alert;Cooperative;Doesn't follow directions Oral Cavity Assessment: Within Functional Limits Oral Care Completed by SLP: No Oral Cavity - Dentition: Adequate natural dentition Vision: Functional for self-feeding Self-Feeding Abilities: Able to feed self Patient Positioning: Upright in bed Baseline Vocal Quality: Normal Volitional Cough: Cognitively unable to elicit Volitional Swallow: Unable to elicit    Oral/Motor/Sensory Function Overall Oral Motor/Sensory Function: Within functional limits (no f/c, but no sign of weakness)   Ice Chips     Thin Liquid Thin Liquid: Within functional limits Presentation: Cup;Straw    Nectar Thick Nectar Thick Liquid: Not tested   Honey Thick Honey Thick Liquid: Not tested   Puree Puree: Within functional limits   Solid   GO   Solid: Impaired Presentation: Self Fed Oral Phase Impairments: Impaired mastication Oral Phase Functional Implications:  Prolonged oral transit;Impaired mastication Pharyngeal Phase Impairments:  Suspected delayed Joneen RoachSwallow       Corayma Cashatt, MA CCC-SLP [phone]  Jamilla Galli, Riley NearingBonnie Caroline 08/20/2015,10:58 AM

## 2015-08-21 ENCOUNTER — Inpatient Hospital Stay (HOSPITAL_COMMUNITY): Payer: Commercial Managed Care - HMO

## 2015-08-21 ENCOUNTER — Encounter (HOSPITAL_COMMUNITY)
Admission: EM | Disposition: A | Discharge status: Home / Self Care | Payer: Self-pay | Source: Home / Self Care | Attending: Internal Medicine

## 2015-08-21 DIAGNOSIS — I9589 Other hypotension: Secondary | ICD-10-CM

## 2015-08-21 DIAGNOSIS — N390 Urinary tract infection, site not specified: Secondary | ICD-10-CM

## 2015-08-21 DIAGNOSIS — K222 Esophageal obstruction: Secondary | ICD-10-CM

## 2015-08-21 DIAGNOSIS — R1310 Dysphagia, unspecified: Secondary | ICD-10-CM

## 2015-08-21 DIAGNOSIS — G9341 Metabolic encephalopathy: Secondary | ICD-10-CM

## 2015-08-21 DIAGNOSIS — Z9189 Other specified personal risk factors, not elsewhere classified: Secondary | ICD-10-CM

## 2015-08-21 LAB — LEGIONELLA PNEUMOPHILA SEROGP 1 UR AG: L. PNEUMOPHILA SEROGP 1 UR AG: NEGATIVE

## 2015-08-21 LAB — MAGNESIUM: MAGNESIUM: 1.8 mg/dL (ref 1.7–2.4)

## 2015-08-21 LAB — BASIC METABOLIC PANEL
ANION GAP: 4 — AB (ref 5–15)
BUN: 7 mg/dL (ref 6–20)
CHLORIDE: 108 mmol/L (ref 101–111)
CO2: 26 mmol/L (ref 22–32)
Calcium: 7.9 mg/dL — ABNORMAL LOW (ref 8.9–10.3)
Creatinine, Ser: 0.99 mg/dL (ref 0.61–1.24)
GFR calc non Af Amer: >60 mL/min (ref >60)
Glucose, Bld: 90 mg/dL (ref 65–99)
Potassium: 3.4 mmol/L — ABNORMAL LOW (ref 3.5–5.1)
Sodium: 138 mmol/L (ref 135–145)

## 2015-08-21 LAB — CBC
HEMATOCRIT: 33 % — AB (ref 39.0–52.0)
HEMOGLOBIN: 10.5 g/dL — AB (ref 13.0–17.0)
MCH: 30.2 pg (ref 26.0–34.0)
MCHC: 31.8 g/dL (ref 30.0–36.0)
MCV: 94.8 fL (ref 78.0–100.0)
Platelets: 182 10*3/uL (ref 150–400)
RBC: 3.48 MIL/uL — AB (ref 4.22–5.81)
RDW: 15.5 % (ref 11.5–15.5)
WBC: 3.9 10*3/uL — AB (ref 4.0–10.5)

## 2015-08-21 SURGERY — ESOPHAGOGASTRODUODENOSCOPY (EGD) WITH PROPOFOL
Anesthesia: Monitor Anesthesia Care

## 2015-08-21 MED ORDER — ENSURE ENLIVE PO LIQD: 237.0000 mL | Freq: Three times a day (TID) | ORAL | Status: DC

## 2015-08-21 MED ORDER — AMOXICILLIN-POT CLAVULANATE 875-125 MG PO TABS: 1.0000 | ORAL_TABLET | Freq: Two times a day (BID) | ORAL | Status: AC

## 2015-08-21 MED ORDER — ENSURE ENLIVE PO LIQD: 237.0000 mL | Freq: Three times a day (TID) | ORAL | Status: AC

## 2015-08-21 MED ORDER — AMOXICILLIN-POT CLAVULANATE 875-125 MG PO TABS: 1.0000 | ORAL_TABLET | Freq: Two times a day (BID) | ORAL | Status: DC

## 2015-08-21 MED ORDER — SACCHAROMYCES BOULARDII 250 MG PO CAPS: 250.0000 mg | ORAL_CAPSULE | Freq: Two times a day (BID) | ORAL | Status: DC

## 2015-08-21 NOTE — Progress Notes (Signed)
Nsg Discharge Note  Admit Date:  08/19/2015 Discharge date: 08/21/2015   Jeremy Murillo to be D/C'd Home ( Home group for down syndrome) per MD order.  AVS completed.  Copy for chart, and copy for patient signed, and dated. Patient/caregiver able to verbalize understanding. Paper scripts given with transporter.   Discharge Medication:   Medication List    TAKE these medications        acetaminophen 325 MG tablet  Commonly known as:  TYLENOL  Take 650 mg by mouth every 4 (four) hours as needed for mild pain or fever.     alum & mag hydroxide-simeth 200-200-20 MG/5ML suspension  Commonly known as:  MAALOX/MYLANTA  Take by mouth as needed for indigestion or heartburn.     amoxicillin-clavulanate 875-125 MG tablet  Commonly known as:  AUGMENTIN  Take 1 tablet by mouth 2 (two) times daily.     bisacodyl 10 MG suppository  Commonly known as:  DULCOLAX  Place 10 mg rectally as needed for moderate constipation.     brompheniramine-pseudoephedrine 1-15 MG/5ML Elix  Commonly known as:  DIMETAPP  Take by mouth as needed for allergies.     carbamide peroxide 6.5 % otic solution  Commonly known as:  DEBROX  5 drops 2 (two) times daily.     coal tar 0.5 % shampoo  Commonly known as:  NEUTROGENA T-GEL  Apply 1 application topically every evening.     diphenhydrAMINE 25 MG tablet  Commonly known as:  BENADRYL  Take 25 mg by mouth every 4 (four) hours as needed.     docusate sodium 100 MG capsule  Commonly known as:  COLACE  Take 100 mg by mouth at bedtime.     donepezil 5 MG tablet  Commonly known as:  ARICEPT  Take 1 tablet (5 mg total) by mouth daily.     esomeprazole 20 MG capsule  Commonly known as:  NEXIUM  Take 20 mg by mouth 2 (two) times daily before a meal.     eucerin cream  Apply 1 application topically 2 (two) times daily.     feeding supplement (ENSURE ENLIVE) Liqd  Take 237 mLs by mouth 3 (three) times daily between meals.     hydrocortisone 25 MG  suppository  Commonly known as:  ANUSOL-HC  Place 25 mg rectally 3 (three) times daily as needed for hemorrhoids or itching.     levothyroxine 100 MCG tablet  Commonly known as:  SYNTHROID, LEVOTHROID  Take 100 mcg by mouth daily before breakfast.     lip balm ointment  Apply 1 application topically as needed for lip care.     loperamide 2 MG tablet  Commonly known as:  IMODIUM A-D  Take 4 mg by mouth as needed for diarrhea or loose stools.     magnesium hydroxide 400 MG/5ML suspension  Commonly known as:  MILK OF MAGNESIA  Take by mouth daily as needed for mild constipation.     memantine 28 MG Cp24 24 hr capsule  Commonly known as:  NAMENDA XR  Take 1 capsule (28 mg total) by mouth daily.     metroNIDAZOLE 1 % gel  Commonly known as:  METROGEL  Apply 1 application topically daily.     neomycin-bacitracin-polymyxin ointment  Commonly known as:  NEOSPORIN  Apply 1 application topically as needed for wound care. apply to eye     OFF DEEP WOODS DRY Aero  Apply 1 application topically as needed.     promethazine 25  MG tablet  Commonly known as:  PHENERGAN  Take 25 mg by mouth every 4 (four) hours as needed for nausea or vomiting.     saccharomyces boulardii 250 MG capsule  Commonly known as:  FLORASTOR  Take 1 capsule (250 mg total) by mouth 2 (two) times daily.     simvastatin 10 MG tablet  Commonly known as:  ZOCOR  Take 10 mg by mouth at bedtime.     vitamin A & D ointment  Apply 1 application topically as needed for dry skin.     vitamin E 200 UNIT capsule  Generic drug:  vitamin E  Take 200 Units by mouth daily.     zinc oxide 20 % ointment  Apply 1 application topically 3 (three) times daily. And PRN        Discharge Assessment: Filed Vitals:   08/21/15 1409 08/21/15 1535  BP: 86/50 93/51  Pulse: 67   Temp: 97.5 F (36.4 C)   Resp: 17    Skin clean, dry and intact without evidence of skin break down, no evidence of skin tears noted. IV catheter  discontinued intact. Site without signs and symptoms of complications - no redness or edema noted at insertion site, patient denies c/o pain - only slight tenderness at site.  Dressing with slight pressure applied.  D/c Instructions-Education: Discharge instructions given to patient/family with verbalized understanding. D/c education completed with patient/family including follow up instructions, medication list, d/c activities limitations if indicated, with other d/c instructions as indicated by MD - patient able to verbalize understanding, all questions fully answered. Patient instructed to return to ED, call 911, or call MD for any changes in condition.  Patient escorted via WC, and D/C home via private auto.  Jeremy Murillo, Jeremy Devora L, RN 08/21/2015 5:16 PM

## 2015-08-21 NOTE — Discharge Summary (Addendum)
Discharge Summary  Jeremy Murillo NGE:[id number] DOB: November 10, 1950  PCP: Jeremy Murillo  Admit date: 08/19/2015 Discharge date: 08/21/2015  Time spent: >81mins  Recommendations for Outpatient Follow-up:  1. F/u with PMD within a week  for hospital discharge follow up, repeat cbc/bmp at follow up, pmd to follow up on final urine culture result.  Discharge Diagnoses:  Active Hospital Problems   Diagnosis Date Noted  . Hypotension 08/19/2015  . Altered mental status 08/19/2015  . Dysphagia 01/03/2014  . DOWNS SYNDROME 12/11/2008  . Diaphragmatic hernia 12/11/2008  . ESOPHAGEAL STRICTURE 12/11/2008  . Hemorrhoids 12/11/2008    Resolved Hospital Problems   Diagnosis Date Noted Date Resolved  No resolved problems to display.    Discharge Condition: stable  Diet recommendation: continue aspiration precaution  Puree diet/Thin liquid   Liquid Administration via: Cup;Straw Medication Administration: Crushed with puree Supervision: Patient able to self feed Compensations: Slow rate;Small sips/bites Postural Changes: Seated upright at 90 degrees   Filed Weights   08/19/15 1532  Weight: 52.164 kg (115 lb)    History of present illness:  Chief Complaint: Confusion and Hypotension  HPI: Jeremy Murillo is a 65 y.o. male with medical history significant for Down's syndrome, GERD/Barret's with chronic dysphagia, hypothyroidism, cognitive deficiencies, brought to the ED From his Group home for evaluation of increased confusion with slurred speech, generalized weakness favoring the left side, and inability to stand up without leaning. LAst seen normal last night. Sitter reports that patient was weak and had decreased oral intake 2-3 days prior to admission. This morning he fell into his food and it is unknown if he had aspirated.  Unknown if he had any sick contacts. Sitter denies patient had any respiratory or cardiac complaints prior to admission. He had not been afebrile. He has  some issues with urinary incontinence, for which he uses a condom catheter. He also has chronic constipation. No bleeding issues have been reported. No tick or other insect bites. He was afebrile on presentation  Once Receiving 1 l IVF, his overall mental status began to improve almost to to his baseline.    ED Course: BP 87/55 mmHg  Pulse 82  Temp(Src) 98.3 F (36.8 C) (Oral)  Resp 16  SpO2 96% BP was in the 70/50s at its lowest. Afebrile  CXR Lower lung volumes with asymmetric patchy perihilar airspace opacities on the right suspicious for aspirat CT head negative for acute intracranial abnormalities Lactic acid 2.19 early this morning, trending LA pending  WBC 15. 1.  EKG On SR without ACS CBG 103 but runs in the lower values   Hospital Course:  Principal Problem:   Hypotension Active Problems:   Hemorrhoids   ESOPHAGEAL STRICTURE   Diaphragmatic hernia   DOWNS SYNDROME   Dysphagia   Altered mental status   Aspiration pna with sepsis presented on admission:  CXR concerning for R aspiration PNA/pneumonitis, Lactic acid 2.19,  WBC 15. 1. With fever, hypotension on admission sputum not collected due to patient does not have cough, legionella Ag pending , strep Ag negative, Urine culture with ecoli, sensitivity pending, Blood cultures no growth Ivf/ Unasyn per Pharmacy since admission Lactic acid and wbc normalized, fever resolved.  Patient is discharged on augmentin.   history of chronic dysphagia, SLP/ nutrition consult, on dysphagia diet and Aspiration precuation  H/o esophageal stricture:  GI consulted,per GI  " He appears to have mild stricture right above the EG junction, non obstructive. Barium follows through without any delay. It doesn't appear  to be tight or a very narrow caliber stricture. Hold off EGD with dilation . Patient didn't have regurgitation or impaired clearance from esophagus during barium swallow."  Hypotension without syncope, with  increased debilitation Likely due to dehydration and sepsis. BP in 70s/50s on arrival . Baseline BP around 90/60s;  CT head negative for acute intracranial abnormalities.  EKG on SR without ACS bp stabilized after hydration and abx.   Possible UTI: patient is not able to provide history, he did present with confusion/fever/hypotension/leukocytosis/lactic acidosis, but also has aspiration pna. He is treated with abx, urine culture + ecoli, sensitivity pending, review past culture result he has pan sensitive ecoli in the urine in 2011, he is treated with unasyn in the hospital and discharged on augmentin. pmd to follow up on final culture result.  Cr elevation: cr 1.29 on admission, normalized at discharge cr 0.99 at discharge.   Down's syndrome, and acute confusional state, likely from metabolic encephalopathy due to aspiration pna and possible UTI : admitted with increased confusion as above, likely due to dehydration/infection. Per patient's caregiver he is returning to baseline Continue Aricept and Namenda   Hypothyroidism: -Continue home Synthroid   "WOC wound consult note Reason for Consult: Consult requested for bilat buttocks and inner gluteal fold. Appearance consistent with moisture associated skin damage and patchy areas of partial thickness skin loss. Wound type: Inner gluteal fold with approx 7X1X.1cm fissure, red and moist wound bed related to moisture, surrounded by red macerated skin. Bilat buttocks with multiple round, red partial thickness lesions in patchy areas, surrounded by red macular papular rash which may be related to candidiasis. Measurement: Affected bilat buttocks and inner gluteal fold involved areas approx 10X10cm Dressing procedure/placement/frequency: Air mattress to increase airflow and decrease pressure. Pt currently has a condom cath to contain urine. Barrier cream to protect and repel moisture, and antifungal powder. No family members present to discuss  plan of care and patient does not appear to understand. Please re-consult if further assistance is needed. Mardee Postin MSN, RN, CWOCN, [place], CNS [phone] "   DVT prophylaxis while in the hospital: Lovenox  Code Status: Full  Family Communication: none at bedside Disposition Plan: discharged back to Group home on 6/30 Consults called: speech, Nutrition , GI   Antibiotics:  unasyn   Procedures:  Barium swallow study on 6/30  Consultations:  Speech   gastroenterology  Discharge Exam: BP 86/50 mmHg  Pulse 67  Temp(Src) 97.5 F (36.4 C) (Oral)  Resp 17  Ht 4\' 9"  (1.448 m)  Wt 52.164 kg (115 lb)  BMI 24.88 kg/m2  SpO2 95%    General: NAD, today more interactive, does follow commands but inconsistently  Cardiovascular: RRR  Respiratory: CTABL  Abdomen: Soft/ND/NT, positive BS  Musculoskeletal: No Edema  Neuro: alert, seems at baseline   Discharge Instructions You were cared for by a hospitalist during your hospital stay. If you have any questions about your discharge medications or the care you received while you were in the hospital after you are discharged, you can call the unit and asked to speak with the hospitalist on call if the hospitalist that took care of you is not available. Once you are discharged, your primary care physician will handle any further medical issues. Please note that NO REFILLS for any discharge medications will be authorized once you are discharged, as it is imperative that you return to your primary care physician (or establish a relationship with a primary care physician if you do  not have one) for your aftercare needs so that they can reassess your need for medications and monitor your lab values.      Discharge Instructions    Discharge instructions    Complete by:  As directed   Resume puree diet, meds to be crushed, continue aspiration precaution     Increase activity slowly    Complete by:  As  directed             Medication List    TAKE these medications        acetaminophen 325 MG tablet  Commonly known as:  TYLENOL  Take 650 mg by mouth every 4 (four) hours as needed for mild pain or fever.     alum & mag hydroxide-simeth 200-200-20 MG/5ML suspension  Commonly known as:  MAALOX/MYLANTA  Take by mouth as needed for indigestion or heartburn.     amoxicillin-clavulanate 875-125 MG tablet  Commonly known as:  AUGMENTIN  Take 1 tablet by mouth 2 (two) times daily.     bisacodyl 10 MG suppository  Commonly known as:  DULCOLAX  Place 10 mg rectally as needed for moderate constipation.     brompheniramine-pseudoephedrine 1-15 MG/5ML Elix  Commonly known as:  DIMETAPP  Take by mouth as needed for allergies.     carbamide peroxide 6.5 % otic solution  Commonly known as:  DEBROX  5 drops 2 (two) times daily.     coal tar 0.5 % shampoo  Commonly known as:  NEUTROGENA T-GEL  Apply 1 application topically every evening.     diphenhydrAMINE 25 MG tablet  Commonly known as:  BENADRYL  Take 25 mg by mouth every 4 (four) hours as needed.     docusate sodium 100 MG capsule  Commonly known as:  COLACE  Take 100 mg by mouth at bedtime.     donepezil 5 MG tablet  Commonly known as:  ARICEPT  Take 1 tablet (5 mg total) by mouth daily.     esomeprazole 20 MG capsule  Commonly known as:  NEXIUM  Take 20 mg by mouth 2 (two) times daily before a meal.     eucerin cream  Apply 1 application topically 2 (two) times daily.     feeding supplement (ENSURE ENLIVE) Liqd  Take 237 mLs by mouth 3 (three) times daily between meals.     hydrocortisone 25 MG suppository  Commonly known as:  ANUSOL-HC  Place 25 mg rectally 3 (three) times daily as needed for hemorrhoids or itching.     levothyroxine 100 MCG tablet  Commonly known as:  SYNTHROID, LEVOTHROID  Take 100 mcg by mouth daily before breakfast.     lip balm ointment  Apply 1 application topically as needed for lip  care.     loperamide 2 MG tablet  Commonly known as:  IMODIUM A-D  Take 4 mg by mouth as needed for diarrhea or loose stools.     magnesium hydroxide 400 MG/5ML suspension  Commonly known as:  MILK OF MAGNESIA  Take by mouth daily as needed for mild constipation.     memantine 28 MG Cp24 24 hr capsule  Commonly known as:  NAMENDA XR  Take 1 capsule (28 mg total) by mouth daily.     metroNIDAZOLE 1 % gel  Commonly known as:  METROGEL  Apply 1 application topically daily.     neomycin-bacitracin-polymyxin ointment  Commonly known as:  NEOSPORIN  Apply 1 application topically as needed for wound care. apply to  eye     OFF DEEP WOODS DRY Aero  Apply 1 application topically as needed.     promethazine 25 MG tablet  Commonly known as:  PHENERGAN  Take 25 mg by mouth every 4 (four) hours as needed for nausea or vomiting.     saccharomyces boulardii 250 MG capsule  Commonly known as:  FLORASTOR  Take 1 capsule (250 mg total) by mouth 2 (two) times daily.     simvastatin 10 MG tablet  Commonly known as:  ZOCOR  Take 10 mg by mouth at bedtime.     vitamin A & D ointment  Apply 1 application topically as needed for dry skin.     vitamin E 200 UNIT capsule  Generic drug:  vitamin E  Take 200 Units by mouth daily.     zinc oxide 20 % ointment  Apply 1 application topically 3 (three) times daily. And PRN       No Known Allergies    The results of significant diagnostics from this hospitalization (including imaging, microbiology, ancillary and laboratory) are listed below for reference.    Significant Diagnostic Studies: Dg Chest 2 View  08/20/2015  CLINICAL DATA:  History of aspiration pneumonia with weakness EXAM: CHEST  2 VIEW COMPARISON:  08/19/2015 FINDINGS: Cardiac shadow is stable. Some mild interval clearing is noted in the right lung base when compare with the prior exam. No new focal infiltrate is seen. No acute bony abnormality is noted. Small right pleural  effusion remains. IMPRESSION: Interval improved aeration in the right lung base. Electronically Signed   By: Alcide Clever M.D.   On: 08/20/2015 07:57   Dg Chest 2 View  08/19/2015  CLINICAL DATA:  65 year old with slurred speech and left-sided weakness since yesterday. History of dementia and hypertension. EXAM: CHEST  2 VIEW COMPARISON:  10/16/2010 and 01/04/2008. FINDINGS: There are significantly lower lung volumes. The heart size and mediastinal contours are stable. There is increased vascular congestion with central airway thickening and asymmetric patchy perihilar airspace opacities on the right. There is no consolidation, pneumothorax or significant pleural effusion. The bones appear unchanged. IMPRESSION: Lower lung volumes with asymmetric patchy perihilar airspace opacities on the right suspicious for aspiration. Electronically Signed   By: Carey Bullocks M.D.   On: 08/19/2015 11:52   Ct Head Wo Contrast  08/19/2015  CLINICAL DATA:  Slurred speech, left-sided weakness. EXAM: CT HEAD WITHOUT CONTRAST TECHNIQUE: Contiguous axial images were obtained from the base of the skull through the vertex without intravenous contrast. COMPARISON:  12/10/2012 FINDINGS: There is atrophy and chronic small vessel disease changes. No acute intracranial abnormality. Specifically, no hemorrhage, hydrocephalus, mass lesion, acute infarction, or significant intracranial injury. No acute calvarial abnormality. Mucosal thickening in the ethmoid air cells. Mastoid air cells are clear. IMPRESSION: No acute intracranial abnormality. Atrophy, chronic microvascular disease. Chronic ethmoid sinusitis. Electronically Signed   By: Charlett Nose M.D.   On: 08/19/2015 11:43   Dg Esophagus  08/21/2015  CLINICAL DATA:  Evaluate for stricture and aspiration. EXAM: ESOPHOGRAM/BARIUM SWALLOW TECHNIQUE: Single contrast examination was performed using  thin barium. FLUOROSCOPY TIME:  Fluoroscopy Time:  1 minutes 27 seconds COMPARISON:   Chest radiograph 08/20/2015. FINDINGS: The patient swallowed thin barium and multiple images of the esophagus were obtained with single-contrast, limiting evaluation of the mucosa. The esophagus was distended adequately. There is a small hiatal hernia. Just proximal to the hiatal hernia is focal narrowing of the esophagus which does not distend. After the examination, there  was no evidence of contrast material within the lungs to suggest aspiration during the examination. IMPRESSION: Small hiatal hernia with structure just proximal to the hernia. No evidence for aspiration during this examination. Electronically Signed   By: Annia Belt M.D.   On: 08/21/2015 09:06   Dg Chest Port 1 View  08/19/2015  CLINICAL DATA:  Bloody sputum.  Aspiration pneumonia. EXAM: PORTABLE CHEST 1 VIEW COMPARISON:  Earlier today FINDINGS: Pneumonia on the right is stable from prior. There is no visible cavitation. Trace right pleural effusion. Skin fold on the right. No evidence of pneumothorax. Normal heart size and mediastinal contours. IMPRESSION: Stable right-sided aspiration/pneumonia. Trace right pleural effusion. Electronically Signed   By: Marnee Spring M.D.   On: 08/19/2015 20:05    Microbiology: Recent Results (from the past 240 hour(s))  Blood culture (routine x 2)     Status: None (Preliminary result)   Collection Time: 08/19/15 11:10 AM  Result Value Ref Range Status   Specimen Description BLOOD RIGHT ANTECUBITAL  Final   Special Requests BOTTLES DRAWN AEROBIC AND ANAEROBIC 4CC  Final   Culture NO GROWTH 2 DAYS  Final   Report Status PENDING  Incomplete  Blood culture (routine x 2)     Status: None (Preliminary result)   Collection Time: 08/19/15 11:19 AM  Result Value Ref Range Status   Specimen Description BLOOD LEFT ANTECUBITAL  Final   Special Requests BOTTLES DRAWN AEROBIC ONLY 5CC  Final   Culture NO GROWTH 2 DAYS  Final   Report Status PENDING  Incomplete  MRSA PCR Screening     Status: None    Collection Time: 08/19/15  6:14 PM  Result Value Ref Range Status   MRSA by PCR NEGATIVE NEGATIVE Final    Comment:        The GeneXpert MRSA Assay (FDA approved for NASAL specimens only), is one component of a comprehensive MRSA colonization surveillance program. It is not intended to diagnose MRSA infection nor to guide or monitor treatment for MRSA infections.   Urine culture     Status: Abnormal (Preliminary result)   Collection Time: 08/19/15  9:27 PM  Result Value Ref Range Status   Specimen Description URINE, CLEAN CATCH  Final   Special Requests NONE  Final   Culture >=100,000 COLONIES/mL ESCHERICHIA COLI (A)  Final   Report Status PENDING  Incomplete     Labs: Basic Metabolic Panel:  Recent Labs Lab 08/19/15 1039 08/20/15 0553 08/21/15 0613  NA 136 141 138  K 4.1 3.8 3.4*  CL 103 112* 108  CO2 25 21* 26  GLUCOSE 143* 83 90  BUN 17 12 7   CREATININE 1.29* 1.09 0.99  CALCIUM 8.9 7.9* 7.9*  MG  --   --  1.8   Liver Function Tests:  Recent Labs Lab 08/19/15 1039 08/20/15 0553  AST 31 24  ALT 21 16*  ALKPHOS 88 63  BILITOT 0.5 0.5  PROT 6.6 5.0*  ALBUMIN 3.1* 2.2*   No results for input(s): LIPASE, AMYLASE in the last 168 hours. No results for input(s): AMMONIA in the last 168 hours. CBC:  Recent Labs Lab 08/19/15 1039 08/20/15 0553 08/21/15 0613  WBC 15.1* 6.6 3.9*  NEUTROABS 14.1*  --   --   HGB 13.2 10.7* 10.5*  HCT 40.4 32.0* 33.0*  MCV 92.9 95.5 94.8  PLT 199 162 182   Cardiac Enzymes: No results for input(s): CKTOTAL, CKMB, CKMBINDEX, TROPONINI in the last 168 hours. BNP: BNP (last 3  results) No results for input(s): BNP in the last 8760 hours.  ProBNP (last 3 results) No results for input(s): PROBNP in the last 8760 hours.  CBG:  Recent Labs Lab 08/19/15 1202  GLUCAP 103*       Signed:  Brandi Armato MD, PhD  [facility] 08/21/2015, 3:14 PM

## 2015-08-21 NOTE — Progress Notes (Signed)
Daily Rounding Note  08/21/2015, 10:49 AM  LOS: 1 day   SUBJECTIVE:       [facility].  He has beenn on purees and thin liquids for a long time.  Seems to swallow ok.  Reports increase in Namenda dose in recentl months due to advancing dementia. Staff here reports he is chewing on everything he can get his hands on, he even bit a hole in the mittens which are filled with plastic beads.  Now alert and swallowing liquids without problem  OBJECTIVE:         Vital signs in last 24 hours:    Temp:  [97.7 F (36.5 C)-98.2 F (36.8 C)] 98.1 F (36.7 C) (06/30 0525) Pulse Rate:  [62-75] 71 (06/30 0525) Resp:  [18] 18 (06/30 0525) BP: (99-103)/(51-53) 103/53 mmHg (06/30 0525) SpO2:  [95 %-98 %] 95 % (06/30 0525) Last BM Date:  (PTA) Filed Weights   08/19/15 1532  Weight: 52.164 kg (115 lb)   General: looks well, not toxic or ill   Heart: RRR Chest: clear bil.   Abdomen: soft, NT, active BS, ND  Extremities: no CCE Neuro/Psych:  Alert, follows commands inconsistently.  Speech is hit or miss and hard to understand.  Moves all 4s.   Intake/Output from previous day: 06/29 0701 - 06/30 0700 In: 1271.7 [I.V.:1171.7; IV Piggyback:100] Out: 1125 [Urine:1125]  Intake/Output this shift:    Lab Results:  Recent Labs  08/19/15 1039 08/20/15 0553 08/21/15 0613  WBC 15.1* 6.6 3.9*  HGB 13.2 10.7* 10.5*  HCT 40.4 32.0* 33.0*  PLT 199 162 182   BMET  Recent Labs  08/19/15 1039 08/20/15 0553 08/21/15 0613  NA 136 141 138  K 4.1 3.8 3.4*  CL 103 112* 108  CO2 25 21* 26  GLUCOSE 143* 83 90  BUN 17 12 7   CREATININE 1.29* 1.09 0.99  CALCIUM 8.9 7.9* 7.9*   LFT  Recent Labs  08/19/15 1039 08/20/15 0553  PROT 6.6 5.0*  ALBUMIN 3.1* 2.2*  AST 31 24  ALT 21 16*  ALKPHOS 88 63  BILITOT 0.5 0.5   PT/INR No results for input(s): LABPROT, INR in the last 72 hours. Hepatitis Panel No  results for input(s): HEPBSAG, HCVAB, HEPAIGM, HEPBIGM in the last 72 hours.  Studies/Results: Dg Chest 2 View  08/20/2015  CLINICAL DATA:  History of aspiration pneumonia with weakness EXAM: CHEST  2 VIEW COMPARISON:  08/19/2015 FINDINGS: Cardiac shadow is stable. Some mild interval clearing is noted in the right lung base when compare with the prior exam. No new focal infiltrate is seen. No acute bony abnormality is noted. Small right pleural effusion remains. IMPRESSION: Interval improved aeration in the right lung base. Electronically Signed   By: Alcide CleverMark  Lukens M.D.   On: 08/20/2015 07:57   Dg Chest 2 View  08/19/2015  CLINICAL DATA:  65 year old with slurred speech and left-sided weakness since yesterday. History of dementia and hypertension. EXAM: CHEST  2 VIEW COMPARISON:  10/16/2010 and 01/04/2008. FINDINGS: There are significantly lower lung volumes. The heart size and mediastinal contours are stable. There is increased vascular congestion with central airway thickening and asymmetric patchy perihilar airspace opacities on the right. There is no consolidation, pneumothorax or significant pleural effusion. The bones appear unchanged. IMPRESSION: Lower lung volumes with asymmetric patchy perihilar airspace opacities on the right suspicious for aspiration. Electronically Signed   By: Hilarie FredricksonWilliam  Veazey M.D.  On: 08/19/2015 11:52   Ct Head Wo Contrast  08/19/2015  CLINICAL DATA:  Slurred speech, left-sided weakness. EXAM: CT HEAD WITHOUT CONTRAST TECHNIQUE: Contiguous axial images were obtained from the base of the skull through the vertex without intravenous contrast. COMPARISON:  12/10/2012 FINDINGS: There is atrophy and chronic small vessel disease changes. No acute intracranial abnormality. Specifically, no hemorrhage, hydrocephalus, mass lesion, acute infarction, or significant intracranial injury. No acute calvarial abnormality. Mucosal thickening in the ethmoid air cells. Mastoid air cells are  clear. IMPRESSION: No acute intracranial abnormality. Atrophy, chronic microvascular disease. Chronic ethmoid sinusitis. Electronically Signed   By: Charlett NoseKevin  Dover M.D.   On: 08/19/2015 11:43   Dg Esophagus  08/21/2015  CLINICAL DATA:  Evaluate for stricture and aspiration. EXAM: ESOPHOGRAM/BARIUM SWALLOW TECHNIQUE: Single contrast examination was performed using  thin barium. FLUOROSCOPY TIME:  Fluoroscopy Time:  1 minutes 27 seconds COMPARISON:  Chest radiograph 08/20/2015. FINDINGS: The patient swallowed thin barium and multiple images of the esophagus were obtained with single-contrast, limiting evaluation of the mucosa. The esophagus was distended adequately. There is a small hiatal hernia. Just proximal to the hiatal hernia is focal narrowing of the esophagus which does not distend. After the examination, there was no evidence of contrast material within the lungs to suggest aspiration during the examination. IMPRESSION: Small hiatal hernia with structure just proximal to the hernia. No evidence for aspiration during this examination. Electronically Signed   By: Annia Beltrew  Davis M.D.   On: 08/21/2015 09:06   Dg Chest Port 1 View  08/19/2015  CLINICAL DATA:  Bloody sputum.  Aspiration pneumonia. EXAM: PORTABLE CHEST 1 VIEW COMPARISON:  Earlier today FINDINGS: Pneumonia on the right is stable from prior. There is no visible cavitation. Trace right pleural effusion. Skin fold on the right. No evidence of pneumothorax. Normal heart size and mediastinal contours. IMPRESSION: Stable right-sided aspiration/pneumonia. Trace right pleural effusion. Electronically Signed   By: Marnee SpringJonathon  Watts M.D.   On: 08/19/2015 20:05    ASSESMENT:   *  Right sided PNA, suspect due to aspiration.  Day 3 Unasyn.    *  Previous dilations GE jx esophageal strictures.  Esophagram shows small HH, non-obstructing stricture proximal to small HH at distal esophagus. No aspiration.  Report is that pt eats large food boluses very  rapidly.     PLAN   *  Case D/w Dr Lavon PaganiniNandigam, no plans for EGD.  Advancing diet to mech soft.   Needs help with feeding, to control speed and volume of po intake.   Resume home D1, puree, diet and thin liquids.  From GI stand point he is ok to discharge.   *  Given predilection for chewing on everything, would be ideal if MD could switch him to po Abx and thus be able to remove IV.      Jennye MoccasinSarah Carrianne Hyun  08/21/2015, 10:49 AM Pager: [phone]

## 2015-08-21 NOTE — Progress Notes (Signed)
   08/21/15 1000  Clinical Encounter Type  Visited With Patient  Visit Type Initial  Stress Factors  Patient Stress Factors Health changes;Loss of control   Chaplain visited with patient while doing rounds on the floor.  Patient was unresponsive to questions and a little agitated in the bed. Chaplain offered ministry of presence to patient.  Rosezella FloridaLisa M Sumner Kirchman 08/21/2015 10:04 AM

## 2015-08-21 NOTE — Progress Notes (Signed)
Patient will discharge to group home Anticipated discharge date: 6/30 Family notified: RN notified brother Transportation by group home transport  CSW signing off.  Merlyn LotJenna Holoman, LCSWA Clinical Social Worker [phone]

## 2015-08-21 NOTE — Progress Notes (Addendum)
Spoke with Semone at [phone]. Leison from the facility and she stated she would have someone on the way to pick up patient.  Spoke to brother and he is aware of discharge today

## 2015-08-21 NOTE — Clinical Social Work Note (Signed)
Clinical Social Work Assessment  Patient Details  Name: Jeremy NumbersSteven Murillo MRN: [id number] Date of Birth: [birth year]  Date of referral:  08/21/15               Reason for consult:  Facility Placement                Permission sought to share information with:  Oceanographer[facility]ility Contact Representative Permission granted to share information::  Yes, Verbal Permission Granted  Name::     Multimedia programmerMark  Agency::  [facility]  Relationship::  guardian/brother  Contact Information:     Housing/Transportation Living arrangements for the past 2 months:  Group Home Source of Information:  Power of Attorney Patient Interpreter Needed:  None Criminal Activity/Legal Involvement Pertinent to Current Situation/Hospitalization:  No - Comment as needed Significant Relationships:  Siblings Lives with:  Facility Resident Do you feel safe going back to the place where you live?  Yes Need for family participation in patient care:  Yes (Comment) (decision making)  Care giving concerns: none- no concerns with group home care   Social Worker assessment / plan:  CSW spoke with pt brother concerning pt return to [facility] when stable for DC  Employment status:  Disabled (Comment on whether or not currently receiving Disability) Insurance information:  Managed Medicare PT Recommendations:  Not assessed at this time Information / Referral to community resources:     Patient/Family's Response to care:  Brother is agreeable to return to group home when ready and appreciative of CSW help.  Patient/Family's Understanding of and Emotional Response to Diagnosis, Current Treatment, and Prognosis:  Brother is hopeful pt will be well enough to return home soon.  Emotional Assessment Appearance:  Appears stated age Attitude/Demeanor/Rapport:    Affect (typically observed):  Unable to Assess Orientation:    Alcohol / Substance use:  Not Applicable Psych involvement (Current and /or in the community):  No  (Comment)  Discharge Needs  Concerns to be addressed:  Care Coordination Readmission within the last 30 days:  No Current discharge risk:  Physical Impairment Barriers to Discharge:  Continued Medical Work up   Peabody EnergyHoloman, Mattie Novosel M, LCSW 08/21/2015, 12:51 PM

## 2015-08-21 NOTE — Progress Notes (Signed)
CSW spoke with Theresa/[facility]- they will provide transport when pt is ready for DC- requests DC be sent to [phone]  CSW will continue to follow  Merlyn LotJenna Holoman, Grant Memorial HospitalCSWA Clinical Social Worker [phone]

## 2015-08-22 LAB — URINE CULTURE

## 2015-08-24 LAB — CULTURE, BLOOD (ROUTINE X 2)
CULTURE: NO GROWTH
Culture: NO GROWTH

## 2015-11-23 ENCOUNTER — Ambulatory Visit: Payer: Commercial Managed Care - HMO | Admitting: Neurology

## 2015-11-23 ENCOUNTER — Telehealth: Payer: Self-pay | Admitting: *Deleted

## 2015-11-23 NOTE — Telephone Encounter (Signed)
No showed follow up appointment. 

## 2015-11-24 ENCOUNTER — Encounter: Payer: Self-pay | Admitting: Neurology

## 2016-01-29 ENCOUNTER — Inpatient Hospital Stay (HOSPITAL_COMMUNITY): Payer: Commercial Managed Care - HMO

## 2016-01-29 ENCOUNTER — Inpatient Hospital Stay (HOSPITAL_COMMUNITY)
Admission: EM | Admit: 2016-01-29 | Discharge: 2016-02-22 | DRG: 871 | Disposition: E | Payer: Commercial Managed Care - HMO | Attending: Internal Medicine | Admitting: Internal Medicine

## 2016-01-29 ENCOUNTER — Encounter (HOSPITAL_COMMUNITY): Payer: Self-pay | Admitting: Emergency Medicine

## 2016-01-29 ENCOUNTER — Emergency Department (HOSPITAL_COMMUNITY): Payer: Commercial Managed Care - HMO

## 2016-01-29 DIAGNOSIS — D649 Anemia, unspecified: Secondary | ICD-10-CM | POA: Diagnosis present

## 2016-01-29 DIAGNOSIS — J189 Pneumonia, unspecified organism: Secondary | ICD-10-CM | POA: Diagnosis not present

## 2016-01-29 DIAGNOSIS — Z781 Physical restraint status: Secondary | ICD-10-CM

## 2016-01-29 DIAGNOSIS — N390 Urinary tract infection, site not specified: Secondary | ICD-10-CM | POA: Diagnosis present

## 2016-01-29 DIAGNOSIS — R1310 Dysphagia, unspecified: Secondary | ICD-10-CM

## 2016-01-29 DIAGNOSIS — K219 Gastro-esophageal reflux disease without esophagitis: Secondary | ICD-10-CM | POA: Diagnosis present

## 2016-01-29 DIAGNOSIS — B9789 Other viral agents as the cause of diseases classified elsewhere: Secondary | ICD-10-CM | POA: Diagnosis present

## 2016-01-29 DIAGNOSIS — R1319 Other dysphagia: Secondary | ICD-10-CM | POA: Diagnosis present

## 2016-01-29 DIAGNOSIS — Z0189 Encounter for other specified special examinations: Secondary | ICD-10-CM

## 2016-01-29 DIAGNOSIS — A403 Sepsis due to Streptococcus pneumoniae: Secondary | ICD-10-CM | POA: Diagnosis not present

## 2016-01-29 DIAGNOSIS — A491 Streptococcal infection, unspecified site: Secondary | ICD-10-CM

## 2016-01-29 DIAGNOSIS — J9601 Acute respiratory failure with hypoxia: Secondary | ICD-10-CM | POA: Diagnosis present

## 2016-01-29 DIAGNOSIS — J13 Pneumonia due to Streptococcus pneumoniae: Secondary | ICD-10-CM | POA: Diagnosis present

## 2016-01-29 DIAGNOSIS — R0600 Dyspnea, unspecified: Secondary | ICD-10-CM | POA: Diagnosis not present

## 2016-01-29 DIAGNOSIS — I959 Hypotension, unspecified: Secondary | ICD-10-CM | POA: Diagnosis present

## 2016-01-29 DIAGNOSIS — K222 Esophageal obstruction: Secondary | ICD-10-CM | POA: Diagnosis present

## 2016-01-29 DIAGNOSIS — E876 Hypokalemia: Secondary | ICD-10-CM | POA: Diagnosis present

## 2016-01-29 DIAGNOSIS — Z09 Encounter for follow-up examination after completed treatment for conditions other than malignant neoplasm: Secondary | ICD-10-CM

## 2016-01-29 DIAGNOSIS — F0390 Unspecified dementia without behavioral disturbance: Secondary | ICD-10-CM | POA: Diagnosis present

## 2016-01-29 DIAGNOSIS — Z515 Encounter for palliative care: Secondary | ICD-10-CM | POA: Diagnosis not present

## 2016-01-29 DIAGNOSIS — R6521 Severe sepsis with septic shock: Secondary | ICD-10-CM | POA: Diagnosis present

## 2016-01-29 DIAGNOSIS — E039 Hypothyroidism, unspecified: Secondary | ICD-10-CM | POA: Diagnosis present

## 2016-01-29 DIAGNOSIS — A419 Sepsis, unspecified organism: Secondary | ICD-10-CM

## 2016-01-29 DIAGNOSIS — A401 Sepsis due to streptococcus, group B: Secondary | ICD-10-CM | POA: Diagnosis present

## 2016-01-29 DIAGNOSIS — R0603 Acute respiratory distress: Secondary | ICD-10-CM

## 2016-01-29 DIAGNOSIS — Z79899 Other long term (current) drug therapy: Secondary | ICD-10-CM

## 2016-01-29 DIAGNOSIS — J181 Lobar pneumonia, unspecified organism: Secondary | ICD-10-CM

## 2016-01-29 DIAGNOSIS — Q909 Down syndrome, unspecified: Secondary | ICD-10-CM

## 2016-01-29 DIAGNOSIS — B9620 Unspecified Escherichia coli [E. coli] as the cause of diseases classified elsewhere: Secondary | ICD-10-CM | POA: Diagnosis present

## 2016-01-29 DIAGNOSIS — R0902 Hypoxemia: Secondary | ICD-10-CM

## 2016-01-29 DIAGNOSIS — Z4659 Encounter for fitting and adjustment of other gastrointestinal appliance and device: Secondary | ICD-10-CM

## 2016-01-29 DIAGNOSIS — Z1612 Extended spectrum beta lactamase (ESBL) resistance: Secondary | ICD-10-CM | POA: Diagnosis present

## 2016-01-29 DIAGNOSIS — E785 Hyperlipidemia, unspecified: Secondary | ICD-10-CM | POA: Diagnosis present

## 2016-01-29 DIAGNOSIS — Z66 Do not resuscitate: Secondary | ICD-10-CM | POA: Diagnosis present

## 2016-01-29 DIAGNOSIS — G9340 Encephalopathy, unspecified: Secondary | ICD-10-CM | POA: Diagnosis present

## 2016-01-29 DIAGNOSIS — J90 Pleural effusion, not elsewhere classified: Secondary | ICD-10-CM

## 2016-01-29 DIAGNOSIS — I9589 Other hypotension: Secondary | ICD-10-CM

## 2016-01-29 LAB — URINALYSIS, ROUTINE W REFLEX MICROSCOPIC
BILIRUBIN URINE: NEGATIVE
GLUCOSE, UA: NEGATIVE mg/dL
KETONES UR: NEGATIVE mg/dL
NITRITE: NEGATIVE
PH: 5 (ref 5.0–8.0)
Protein, ur: NEGATIVE mg/dL
Specific Gravity, Urine: 1.009 (ref 1.005–1.030)
Squamous Epithelial / LPF: NONE SEEN

## 2016-01-29 LAB — STREP PNEUMONIAE URINARY ANTIGEN: STREP PNEUMO URINARY ANTIGEN: POSITIVE — AB

## 2016-01-29 LAB — COMPREHENSIVE METABOLIC PANEL
ALBUMIN: 3.5 g/dL (ref 3.5–5.0)
ALK PHOS: 92 U/L (ref 38–126)
ALT: 29 U/L (ref 17–63)
ANION GAP: 10 (ref 5–15)
AST: 47 U/L — ABNORMAL HIGH (ref 15–41)
BUN: 16 mg/dL (ref 6–20)
CALCIUM: 8.8 mg/dL — AB (ref 8.9–10.3)
CHLORIDE: 99 mmol/L — AB (ref 101–111)
CO2: 25 mmol/L (ref 22–32)
Creatinine, Ser: 1.14 mg/dL (ref 0.61–1.24)
GFR calc non Af Amer: >60 mL/min (ref >60)
Glucose, Bld: 125 mg/dL — ABNORMAL HIGH (ref 65–99)
POTASSIUM: 3.8 mmol/L (ref 3.5–5.1)
SODIUM: 134 mmol/L — AB (ref 135–145)
Total Bilirubin: 0.8 mg/dL (ref 0.3–1.2)
Total Protein: 7.2 g/dL (ref 6.5–8.1)

## 2016-01-29 LAB — CBC WITH DIFFERENTIAL/PLATELET
BASOS ABS: 0 10*3/uL (ref 0.0–0.1)
BASOS PCT: 0 %
BASOS PCT: 0 %
Basophils Absolute: 0 10*3/uL (ref 0.0–0.1)
EOS PCT: 0 %
Eosinophils Absolute: 0 10*3/uL (ref 0.0–0.7)
Eosinophils Absolute: 0 10*3/uL (ref 0.0–0.7)
Eosinophils Relative: 0 %
HCT: 35.9 % — ABNORMAL LOW (ref 39.0–52.0)
HEMATOCRIT: 39.6 % (ref 39.0–52.0)
HEMOGLOBIN: 13.4 g/dL (ref 13.0–17.0)
Hemoglobin: 11.9 g/dL — ABNORMAL LOW (ref 13.0–17.0)
LYMPHS PCT: 5 %
LYMPHS PCT: 8 %
Lymphs Abs: 0.6 10*3/uL — ABNORMAL LOW (ref 0.7–4.0)
Lymphs Abs: 0.8 10*3/uL (ref 0.7–4.0)
MCH: 32 pg (ref 26.0–34.0)
MCH: 31.4 pg (ref 26.0–34.0)
MCHC: 33.8 g/dL (ref 30.0–36.0)
MCHC: 33.1 g/dL (ref 30.0–36.0)
MCV: 94.5 fL (ref 78.0–100.0)
MCV: 94.7 fL (ref 78.0–100.0)
MONOS PCT: 5 %
Monocytes Absolute: 0.6 10*3/uL (ref 0.1–1.0)
Monocytes Absolute: 0.5 10*3/uL (ref 0.1–1.0)
Monocytes Relative: 5 %
NEUTROS ABS: 11.8 10*3/uL — AB (ref 1.7–7.7)
NEUTROS PCT: 90 %
Neutro Abs: 8.1 10*3/uL — ABNORMAL HIGH (ref 1.7–7.7)
Neutrophils Relative %: 87 %
PLATELETS: 163 10*3/uL (ref 150–400)
Platelets: 193 10*3/uL (ref 150–400)
RBC: 4.19 MIL/uL — ABNORMAL LOW (ref 4.22–5.81)
RBC: 3.79 MIL/uL — AB (ref 4.22–5.81)
RDW: 15.4 % (ref 11.5–15.5)
RDW: 15.4 % (ref 11.5–15.5)
WBC: 13 10*3/uL — ABNORMAL HIGH (ref 4.0–10.5)
WBC: 9.3 10*3/uL (ref 4.0–10.5)

## 2016-01-29 LAB — BLOOD GAS, ARTERIAL
ACID-BASE DEFICIT: 0.6 mmol/L (ref 0.0–2.0)
BICARBONATE: 22.7 mmol/L (ref 20.0–28.0)
DRAWN BY: 11249
FIO2: 100
O2 SAT: 95.1 %
PATIENT TEMPERATURE: 99.8
pCO2 arterial: 36 mmHg (ref 32.0–48.0)
pH, Arterial: 7.421 (ref 7.350–7.450)
pO2, Arterial: 79.5 mmHg — ABNORMAL LOW (ref 83.0–108.0)

## 2016-01-29 LAB — BASIC METABOLIC PANEL
ANION GAP: 6 (ref 5–15)
BUN: 13 mg/dL (ref 6–20)
CALCIUM: 7.6 mg/dL — AB (ref 8.9–10.3)
CO2: 26 mmol/L (ref 22–32)
Chloride: 105 mmol/L (ref 101–111)
Creatinine, Ser: 1.06 mg/dL (ref 0.61–1.24)
GLUCOSE: 109 mg/dL — AB (ref 65–99)
POTASSIUM: 4.1 mmol/L (ref 3.5–5.1)
SODIUM: 137 mmol/L (ref 135–145)

## 2016-01-29 LAB — MRSA PCR SCREENING: MRSA by PCR: NEGATIVE

## 2016-01-29 LAB — HIV ANTIBODY (ROUTINE TESTING W REFLEX): HIV Screen 4th Generation wRfx: NONREACTIVE

## 2016-01-29 LAB — I-STAT CG4 LACTIC ACID, ED: LACTIC ACID, VENOUS: 2.66 mmol/L — AB (ref 0.5–1.9)

## 2016-01-29 LAB — LACTIC ACID, PLASMA
LACTIC ACID, VENOUS: 1.8 mmol/L (ref 0.5–1.9)
Lactic Acid, Venous: 1.4 mmol/L (ref 0.5–1.9)

## 2016-01-29 LAB — PROCALCITONIN: PROCALCITONIN: 1.88 ng/mL

## 2016-01-29 MED ORDER — SODIUM CHLORIDE 0.9 % IV BOLUS (SEPSIS)
500.0000 mL | Freq: Once | INTRAVENOUS | Status: AC
  Administered 2016-01-29: 500 mL via INTRAVENOUS

## 2016-01-29 MED ORDER — DEXTROSE 5 % IV SOLN
1.0000 g | Freq: Once | INTRAVENOUS | Status: AC
  Administered 2016-01-29: 1 g via INTRAVENOUS
  Filled 2016-01-29: qty 10

## 2016-01-29 MED ORDER — SIMVASTATIN 10 MG PO TABS
10.0000 mg | ORAL_TABLET | Freq: Every day | ORAL | Status: DC
  Administered 2016-01-29: 10 mg via ORAL
  Filled 2016-01-29: qty 1

## 2016-01-29 MED ORDER — ORAL CARE MOUTH RINSE
15.0000 mL | Freq: Two times a day (BID) | OROMUCOSAL | Status: DC
  Administered 2016-01-30 – 2016-02-02 (×5): 15 mL via OROMUCOSAL

## 2016-01-29 MED ORDER — MEMANTINE HCL ER 28 MG PO CP24
28.0000 mg | ORAL_CAPSULE | Freq: Every day | ORAL | Status: DC
  Administered 2016-01-29: 28 mg via ORAL
  Filled 2016-01-29: qty 1

## 2016-01-29 MED ORDER — RISAQUAD PO CAPS
1.0000 | ORAL_CAPSULE | Freq: Two times a day (BID) | ORAL | Status: DC
  Administered 2016-01-29 – 2016-02-02 (×9): 1 via ORAL
  Filled 2016-01-29 (×8): qty 1

## 2016-01-29 MED ORDER — BACITRACIN-NEOMYCIN-POLYMYXIN 400-5-5000 EX OINT: 1.0000 "application " | TOPICAL_OINTMENT | CUTANEOUS | Status: DC | PRN

## 2016-01-29 MED ORDER — HYDROCORTISONE ACETATE 25 MG RE SUPP
25.0000 mg | Freq: Three times a day (TID) | RECTAL | Status: DC | PRN
  Filled 2016-01-29: qty 1

## 2016-01-29 MED ORDER — LORAZEPAM 2 MG/ML IJ SOLN: 1.0000 mg | Freq: Once | INTRAMUSCULAR | Status: DC

## 2016-01-29 MED ORDER — MEMANTINE HCL-DONEPEZIL HCL ER 28-10 MG PO CP24: 1.0000 | ORAL_CAPSULE | Freq: Every day | ORAL | Status: DC

## 2016-01-29 MED ORDER — SODIUM CHLORIDE 0.9 % IV BOLUS (SEPSIS)
250.0000 mL | Freq: Once | INTRAVENOUS | Status: AC
  Administered 2016-01-29: 250 mL via INTRAVENOUS

## 2016-01-29 MED ORDER — MEMANTINE HCL ER 28 MG PO CP24
28.0000 mg | ORAL_CAPSULE | Freq: Every day | ORAL | Status: DC
  Administered 2016-01-29 – 2016-02-02 (×5): 28 mg via ORAL
  Filled 2016-01-29 (×5): qty 1

## 2016-01-29 MED ORDER — LORAZEPAM 2 MG/ML IJ SOLN
1.0000 mg | Freq: Once | INTRAMUSCULAR | Status: AC
  Administered 2016-01-29: 1 mg via INTRAVENOUS
  Filled 2016-01-29: qty 1

## 2016-01-29 MED ORDER — ENSURE ENLIVE PO LIQD
237.0000 mL | ORAL | Status: DC
  Administered 2016-01-30: 237 mL via ORAL

## 2016-01-29 MED ORDER — PANTOPRAZOLE SODIUM 40 MG PO TBEC
40.0000 mg | DELAYED_RELEASE_TABLET | Freq: Two times a day (BID) | ORAL | Status: DC
  Administered 2016-01-29 (×2): 40 mg via ORAL
  Filled 2016-01-29 (×2): qty 1

## 2016-01-29 MED ORDER — ENOXAPARIN SODIUM 40 MG/0.4ML ~~LOC~~ SOLN
40.0000 mg | SUBCUTANEOUS | Status: DC
  Administered 2016-01-29 – 2016-02-02 (×5): 40 mg via SUBCUTANEOUS
  Filled 2016-01-29 (×5): qty 0.4

## 2016-01-29 MED ORDER — IPRATROPIUM-ALBUTEROL 0.5-2.5 (3) MG/3ML IN SOLN
3.0000 mL | RESPIRATORY_TRACT | Status: DC | PRN
  Administered 2016-01-31: 3 mL via RESPIRATORY_TRACT
  Filled 2016-01-29: qty 3

## 2016-01-29 MED ORDER — SODIUM CHLORIDE 0.9 % IV SOLN
INTRAVENOUS | Status: DC
  Administered 2016-01-29 – 2016-02-02 (×7): via INTRAVENOUS

## 2016-01-29 MED ORDER — NOREPINEPHRINE BITARTRATE 1 MG/ML IV SOLN
0.0000 ug/min | INTRAVENOUS | Status: DC
  Administered 2016-01-29: 12 ug/min via INTRAVENOUS
  Administered 2016-01-29: 2 ug/min via INTRAVENOUS
  Administered 2016-01-30: 16 ug/min via INTRAVENOUS
  Administered 2016-01-30: 18 ug/min via INTRAVENOUS
  Administered 2016-01-30: 16 ug/min via INTRAVENOUS
  Administered 2016-01-31: 14 ug/min via INTRAVENOUS
  Administered 2016-01-31: 13 ug/min via INTRAVENOUS
  Administered 2016-01-31: 6 ug/min via INTRAVENOUS
  Administered 2016-02-01 (×2): 2 ug/min via INTRAVENOUS
  Filled 2016-01-29 (×10): qty 4

## 2016-01-29 MED ORDER — GUAIFENESIN 100 MG/5ML PO SOLN: 300.0000 mg | Freq: Three times a day (TID) | ORAL | Status: DC | PRN

## 2016-01-29 MED ORDER — ADULT MULTIVITAMIN LIQUID CH
15.0000 mL | Freq: Every day | ORAL | Status: DC
  Administered 2016-01-29 – 2016-02-02 (×5): 15 mL via ORAL
  Filled 2016-01-29 (×5): qty 15

## 2016-01-29 MED ORDER — LIP MEDEX EX OINT: 1.0000 "application " | TOPICAL_OINTMENT | CUTANEOUS | Status: DC | PRN

## 2016-01-29 MED ORDER — DEXTROSE 5 % IV SOLN
1.0000 g | INTRAVENOUS | Status: DC
  Administered 2016-01-29: 1 g via INTRAVENOUS
  Filled 2016-01-29: qty 10

## 2016-01-29 MED ORDER — DEXTROSE 5 % IV SOLN
500.0000 mg | Freq: Once | INTRAVENOUS | Status: AC
  Administered 2016-01-29: 500 mg via INTRAVENOUS
  Filled 2016-01-29: qty 500

## 2016-01-29 MED ORDER — ACETAMINOPHEN 160 MG/5ML PO SOLN
640.0000 mg | ORAL | Status: DC | PRN
  Administered 2016-01-30: 640 mg via ORAL
  Filled 2016-01-29: qty 20.3

## 2016-01-29 MED ORDER — DONEPEZIL HCL 10 MG PO TABS: 10.0000 mg | ORAL_TABLET | Freq: Every day | ORAL | Status: DC

## 2016-01-29 MED ORDER — DONEPEZIL HCL 10 MG PO TABS
10.0000 mg | ORAL_TABLET | Freq: Every day | ORAL | Status: DC
  Administered 2016-01-29 – 2016-02-02 (×5): 10 mg via ORAL
  Filled 2016-01-29 (×4): qty 1

## 2016-01-29 MED ORDER — SODIUM CHLORIDE 0.9 % IV SOLN
INTRAVENOUS | Status: AC
  Administered 2016-01-29: 05:00:00 via INTRAVENOUS

## 2016-01-29 MED ORDER — DEXTROSE 5 % IV SOLN
500.0000 mg | INTRAVENOUS | Status: DC
  Administered 2016-01-29 – 2016-01-31 (×3): 500 mg via INTRAVENOUS
  Filled 2016-01-29 (×3): qty 500

## 2016-01-29 MED ORDER — SODIUM CHLORIDE 0.9 % IV BOLUS (SEPSIS)
1000.0000 mL | Freq: Once | INTRAVENOUS | Status: AC
  Administered 2016-01-29: 1000 mL via INTRAVENOUS

## 2016-01-29 MED ORDER — LEVOTHYROXINE SODIUM 100 MCG PO TABS
100.0000 ug | ORAL_TABLET | Freq: Every day | ORAL | Status: DC
  Administered 2016-01-29 – 2016-02-02 (×5): 100 ug via ORAL
  Filled 2016-01-29 (×5): qty 1

## 2016-01-29 NOTE — Progress Notes (Signed)
RT NT suctioned patient. Patient tolerated well. RT got tan thick secretions. RT will continue to monitor and suction as needed.

## 2016-01-29 NOTE — Progress Notes (Addendum)
PROGRESS NOTE    Jeremy NumbersSteven Murillo  ZOX:[id number] DOB: 1951/02/20 DOA: 02/01/2016  PCP: Lucretia Fieldoyals, Hoover M   Brief Narrative:  Pt admitted early this AM by my colleague- H and P reviewed. Sent for fever and found to hypoxic.   Subjective: Non-verbal currently  Assessment & Plan:   Principal Problem:   Sepsis - Streptococcal b/l pneumonia- acute resp failure - strep pneumo + urinary antigen -with temp 103, hypotension and hypoxia- currently O2 85% on room air checked by myself - BP dropped in 80s- giving another fluid bolus- not much urine output- will consider pressors if bolus ineffective - Rocephin/ Zithromax- CXR today shows b/l infiltrates  Active Problems: Lactic acidosis  - normalized now    Hypothyroidism - synthroid  Dyslipidemia     DOWNS SYNDROME/ dementia - will re-order just Namazaric which is a home med    GERD (gastroesophageal reflux disease) - PPI  DVT prophylaxis: Lovenox Code Status: Full code Family Communication:  Disposition Plan: follow in SDU Consultants:    Procedures:    Antimicrobials:  Anti-infectives    Start     Dose/Rate Route Frequency Ordered Stop   02/12/2016 2200  azithromycin (ZITHROMAX) 500 mg in dextrose 5 % 250 mL IVPB     500 mg 250 mL/hr over 60 Minutes Intravenous Every 24 hours 02/09/2016 0151     02/08/2016 2200  cefTRIAXone (ROCEPHIN) 1 g in dextrose 5 % 50 mL IVPB     1 g 100 mL/hr over 30 Minutes Intravenous Every 24 hours 02/01/2016 0151     02/15/2016 0145  cefTRIAXone (ROCEPHIN) 1 g in dextrose 5 % 50 mL IVPB     1 g 100 mL/hr over 30 Minutes Intravenous  Once 01/25/2016 0140 02/07/2016 0349   01/23/2016 0145  azithromycin (ZITHROMAX) 500 mg in dextrose 5 % 250 mL IVPB     500 mg 250 mL/hr over 60 Minutes Intravenous  Once 02/09/2016 0140 01/26/2016 0322       Objective: Vitals:   01/28/2016 0600 02/10/2016 0800 02/13/2016 0855 01/30/2016 0900  BP: (!) 87/45   (!) 81/26  Pulse: 96   89  Resp: 18   18  Temp:  98.8 F (37.1 C)      TempSrc:  Oral    SpO2: 98%  96% 97%  Weight:      Height:        Intake/Output Summary (Last 24 hours) at 02/05/2016 0958 Last data filed at 02/21/2016 0600  Gross per 24 hour  Intake             87.5 ml  Output                0 ml  Net             87.5 ml   Filed Weights   02/16/2016 0523  Weight: 47.7 kg (105 lb 2.6 oz)    Examination: General exam: Appears comfortable  HEENT: PERRLA, oral mucosa moist, no sclera icterus or thrush Respiratory system: Clear to auscultation. Respiratory effort normal. Cardiovascular system: S1 & S2 heard, RRR.  No murmurs  Gastrointestinal system: Abdomen soft, non-tender, nondistended. Normal bowel sound. No organomegaly Central nervous system: Alert and oriented. No focal neurological deficits. Extremities: No cyanosis, clubbing or edema Skin: No rashes or ulcers Psychiatry:  Mood & affect appropriate.     Data Reviewed: I have personally reviewed following labs and imaging studies  CBC:  Recent Labs Lab 02/05/2016 0143 01/23/2016 0606  WBC 13.0*  9.3  NEUTROABS 11.8* 8.1*  HGB 13.4 11.9*  HCT 39.6 35.9*  MCV 94.5 94.7  PLT 193 163   Basic Metabolic Panel:  Recent Labs Lab 02/13/2016 0143 02/12/2016 0606  NA 134* 137  K 3.8 4.1  CL 99* 105  CO2 25 26  GLUCOSE 125* 109*  BUN 16 13  CREATININE 1.14 1.06  CALCIUM 8.8* 7.6*   GFR: Estimated Creatinine Clearance: 45.2 mL/min (by C-G formula based on SCr of 1.06 mg/dL). Liver Function Tests:  Recent Labs Lab 02/21/2016 0143  AST 47*  ALT 29  ALKPHOS 92  BILITOT 0.8  PROT 7.2  ALBUMIN 3.5   No results for input(s): LIPASE, AMYLASE in the last 168 hours. No results for input(s): AMMONIA in the last 168 hours. Coagulation Profile: No results for input(s): INR, PROTIME in the last 168 hours. Cardiac Enzymes: No results for input(s): CKTOTAL, CKMB, CKMBINDEX, TROPONINI in the last 168 hours. BNP (last 3 results) No results for input(s): PROBNP in the last 8760  hours. HbA1C: No results for input(s): HGBA1C in the last 72 hours. CBG: No results for input(s): GLUCAP in the last 168 hours. Lipid Profile: No results for input(s): CHOL, HDL, LDLCALC, TRIG, CHOLHDL, LDLDIRECT in the last 72 hours. Thyroid Function Tests: No results for input(s): TSH, T4TOTAL, FREET4, T3FREE, THYROIDAB in the last 72 hours. Anemia Panel: No results for input(s): VITAMINB12, FOLATE, FERRITIN, TIBC, IRON, RETICCTPCT in the last 72 hours. Urine analysis:    Component Value Date/Time   COLORURINE YELLOW 02/10/2016 0604   APPEARANCEUR HAZY (A) 01/26/2016 0604   LABSPEC 1.009 02/07/2016 0604   PHURINE 5.0 01/30/2016 0604   GLUCOSEU NEGATIVE 02/21/2016 0604   HGBUR SMALL (A) 01/22/2016 0604   BILIRUBINUR NEGATIVE 01/28/2016 0604   KETONESUR NEGATIVE 02/09/2016 0604   PROTEINUR NEGATIVE 02/01/2016 0604   UROBILINOGEN 0.2 12/22/2009 1215   NITRITE NEGATIVE 01/30/2016 0604   LEUKOCYTESUR SMALL (A) 01/26/2016 0604   Sepsis Labs: @LABRCNTIP (procalcitonin:4,lacticidven:4) )No results found for this or any previous visit (from the past 240 hour(s)).       Radiology Studies: Dg Chest Port 1 View  Result Date: 02/04/2016 CLINICAL DATA:  Hypoxia EXAM: PORTABLE CHEST 1 VIEW COMPARISON:  01/23/2016 FINDINGS: Airspace opacities noted in the left lower lobe and right lung base concerning for pneumonia. These have increased since prior study. Heart is borderline in size. No visible effusions or acute bony abnormality. IMPRESSION: Worsening bilateral lower lobe airspace opacities, left greater than right concerning for pneumonia. Electronically Signed   By: Charlett Nose M.D.   On: 02/17/2016 09:26   Dg Chest Port 1 View  Result Date: 01/26/2016 CLINICAL DATA:  Fever and hypoxia. EXAM: PORTABLE CHEST 1 VIEW COMPARISON:  08/20/2015 FINDINGS: Volume loss in the left hemithorax with ill-defined left basilar patchy opacity. Minimal right infrahilar opacity with mild improvement  compared to prior radiograph. Unchanged heart size and mediastinal contours. No large pleural effusion is seen. No pulmonary edema. The bones appear under mineralized. IMPRESSION: Patchy left base opacity. This may be pneumonia or aspiration. Right infrahilar opacity has improved from prior exam, with mild streaky recurrent or residual airspace disease. Electronically Signed   By: Rubye Oaks M.D.   On: 01/28/2016 02:31      Scheduled Meds: . sodium chloride   Intravenous STAT  . acidophilus  1 capsule Oral BID  . azithromycin  500 mg Intravenous Q24H  . cefTRIAXone (ROCEPHIN)  IV  1 g Intravenous Q24H  . donepezil  10 mg  Oral QHS  . enoxaparin (LOVENOX) injection  40 mg Subcutaneous Q24H  . levothyroxine  100 mcg Oral QAC breakfast  . mouth rinse  15 mL Mouth Rinse BID  . memantine  28 mg Oral Daily  . pantoprazole  40 mg Oral BID  . simvastatin  10 mg Oral QHS  . sodium chloride  500 mL Intravenous Once   Continuous Infusions: . sodium chloride       LOS: 0 days    Time spent in minutes: 40 min    Kazimir Hartnett, MD [facility] Pager: [email or web address] Password [email or web address] 01/22/2016, 9:58 AM

## 2016-01-29 NOTE — ED Notes (Signed)
Report given to Jessica on 2W

## 2016-01-29 NOTE — ED Triage Notes (Signed)
Per EMS pt had fever of 100.1 Staff at group home reported pt had fever of 103 at 11:30pm but did not call EMS until 12:30. Vitals upon EMS arrival: 90/50 hr104 CBG 103 O2 87% on room air. Put on 3L O2 came up to 92%. Pt has some congestion as well. Staff at group home unable to provide any medical history on pt. Staff at home reported pt had fallen earlier today.

## 2016-01-29 NOTE — Progress Notes (Signed)
eLink Physician-Brief Progress Note Patient Name: Jeremy NumbersSteven Murillo DOB: 04-18-1950 MRN: [id number]   Date of Service  02/01/2016  HPI/Events of Note  Patient admitted with sepsis syndrome with elevated lactate.  Suspect PNA.  Currently on NRB.  Will respond to questions/commands  eICU Interventions  Plan: 1. Check ABg 2. May NTS PRN     Intervention Category Major Interventions: Sepsis - evaluation and management  DETERDING,ELIZABETH 02/12/2016, 5:42 AM

## 2016-01-29 NOTE — ED Provider Notes (Signed)
[facility]-EMERGENCY DEPT Provider Note   CSN: [id number] Arrival date & time: 02/16/2016  0126  By signing my name below, I, Modena Jansky, attest that this documentation has been prepared under the direction and in the presence of Dione Booze, MD . Electronically Signed: Modena Jansky, Scribe. 02/02/2016. 1:30 AM.  History   Chief Complaint Chief Complaint  Patient presents with  . Fever   LEVEL 5 CAVEAT DUE TO ACUITY OF ILLNESS  The history is provided by medical records and the EMS personnel. No language interpreter was used.   HPI Comments: Jeremy Murillo is a 65 y.o. male with a hx of AMS and dysphagia who presents to the Emergency Department complaining of a constant moderate fever that started about 2 hours ago. Per nurse's note, EMS states that pt had a fever of 103 at group home. Per nurse's note,"Vitals upon EMS arrival: 90/50 hr104 CBG 103 O2 87% on room air. Put on 3L O2 came up to 92%." Pt's temperature in the ED today was 101.5.      PCP: Lucretia Field  Past Medical History:  Diagnosis Date  . Astigmatism   . Barrett's esophagus   . Dementia   . Diaphragmatic hernia without mention of obstruction or gangrene   . Down's syndrome   . Dysphagia, unspecified(787.20)   . Esophageal reflux   . Hypothyroid   . Stricture and stenosis of esophagus   . Unspecified hemorrhoids without mention of complication   . Unspecified hypothyroidism     Patient Active Problem List   Diagnosis Date Noted  . Altered mental status 08/19/2015  . Hypotension 08/19/2015  . Aspiration pneumonia (HCC)   . Dysphagia 01/03/2014  . Memory loss 05/14/2013  . HYPOTHYROIDISM 12/11/2008  . Hemorrhoids 12/11/2008  . ESOPHAGEAL STRICTURE 12/11/2008  . Diaphragmatic hernia 12/11/2008  . DOWNS SYNDROME 12/11/2008    Past Surgical History:  Procedure Laterality Date  . NONE         Home Medications    Prior to Admission medications   Medication Sig Start Date End Date Taking?  Authorizing Provider  acetaminophen (TYLENOL) 325 MG tablet Take 650 mg by mouth every 4 (four) hours as needed for mild pain or fever.     Historical Provider, MD  alum & mag hydroxide-simeth (MAALOX/MYLANTA) 200-200-20 MG/5ML suspension Take by mouth as needed for indigestion or heartburn.    Historical Provider, MD  amoxicillin-clavulanate (AUGMENTIN) 875-125 MG tablet Take 1 tablet by mouth 2 (two) times daily. 08/21/15   Albertine Grates, MD  bisacodyl (DULCOLAX) 10 MG suppository Place 10 mg rectally as needed for moderate constipation.    Historical Provider, MD  brompheniramine-pseudoephedrine (DIMETAPP) 1-15 MG/5ML ELIX Take by mouth as needed for allergies.    Historical Provider, MD  carbamide peroxide (DEBROX) 6.5 % otic solution 5 drops 2 (two) times daily.    Historical Provider, MD  coal tar (NEUTROGENA T-GEL) 0.5 % shampoo Apply 1 application topically every evening.     Historical Provider, MD  Diethyltoluamide (OFF DEEP WOODS DRY) AERO Apply 1 application topically as needed.    Historical Provider, MD  diphenhydrAMINE (BENADRYL) 25 MG tablet Take 25 mg by mouth every 4 (four) hours as needed.    Historical Provider, MD  docusate sodium (COLACE) 100 MG capsule Take 100 mg by mouth at bedtime.    Historical Provider, MD  donepezil (ARICEPT) 5 MG tablet Take 1 tablet (5 mg total) by mouth daily. 11/17/14   Nilda Riggs, NP  esomeprazole (NEXIUM)  20 MG capsule Take 20 mg by mouth 2 (two) times daily before a meal.     Historical Provider, MD  feeding supplement, ENSURE ENLIVE, (ENSURE ENLIVE) LIQD Take 237 mLs by mouth 3 (three) times daily between meals. 08/21/15   Albertine Grates, MD  hydrocortisone (ANUSOL-HC) 25 MG suppository Place 25 mg rectally 3 (three) times daily as needed for hemorrhoids or itching.     Historical Provider, MD  levothyroxine (SYNTHROID, LEVOTHROID) 100 MCG tablet Take 100 mcg by mouth daily before breakfast.    Historical Provider, MD  lip balm (CARMEX) ointment Apply 1  application topically as needed for lip care.    Historical Provider, MD  loperamide (IMODIUM A-D) 2 MG tablet Take 4 mg by mouth as needed for diarrhea or loose stools.     Historical Provider, MD  magnesium hydroxide (MILK OF MAGNESIA) 400 MG/5ML suspension Take by mouth daily as needed for mild constipation.    Historical Provider, MD  memantine (NAMENDA XR) 28 MG CP24 24 hr capsule Take 1 capsule (28 mg total) by mouth daily. 11/17/14   Nilda Riggs, NP  metroNIDAZOLE (METROGEL) 1 % gel Apply 1 application topically daily.    Historical Provider, MD  neomycin-bacitracin-polymyxin (NEOSPORIN) ointment Apply 1 application topically as needed for wound care. apply to eye    Historical Provider, MD  promethazine (PHENERGAN) 25 MG tablet Take 25 mg by mouth every 4 (four) hours as needed for nausea or vomiting.    Historical Provider, MD  saccharomyces boulardii (FLORASTOR) 250 MG capsule Take 1 capsule (250 mg total) by mouth 2 (two) times daily. 08/21/15   Albertine Grates, MD  simvastatin (ZOCOR) 10 MG tablet Take 10 mg by mouth at bedtime.    Historical Provider, MD  Skin Protectants, Misc. (EUCERIN) cream Apply 1 application topically 2 (two) times daily.     Historical Provider, MD  vitamin E (VITAMIN E) 200 UNIT capsule Take 200 Units by mouth daily.    Historical Provider, MD  Vitamins A & D (VITAMIN A & D) ointment Apply 1 application topically as needed for dry skin.    Historical Provider, MD  zinc oxide 20 % ointment Apply 1 application topically 3 (three) times daily. And PRN    Historical Provider, MD    Family History Family History  Problem Relation Age of Onset  . Colon cancer Neg Hx   . Esophageal cancer Neg Hx   . Rectal cancer Neg Hx   . Stomach cancer Neg Hx     Social History Social History  Substance Use Topics  . Smoking status: Never Smoker  . Smokeless tobacco: Never Used  . Alcohol use No     Allergies   Patient has no known allergies.   Review of  Systems Review of Systems  Unable to perform ROS: Acuity of condition     Physical Exam Updated Vital Signs BP (!) 94/53 (BP Location: Right Arm)   Pulse 103   Temp 101.5 F (38.6 C) (Rectal)   SpO2 93%   Physical Exam  Constitutional: He appears well-developed and well-nourished.  HENT:  Head: Normocephalic and atraumatic.  Eyes: EOM are normal. Pupils are equal, round, and reactive to light.  Neck: Normal range of motion. Neck supple. No JVD present.  Cardiovascular: Normal rate, regular rhythm and normal heart sounds.   No murmur heard. Pulmonary/Chest: Effort normal and breath sounds normal. He has no wheezes. He has no rales. He exhibits no tenderness.  Diffuse expiratory rhonchi.  Abdominal: Soft. Bowel sounds are normal. He exhibits no distension and no mass. There is no tenderness.  Musculoskeletal: Normal range of motion. He exhibits no edema.  Lymphadenopathy:    He has no cervical adenopathy.  Neurological: He is alert. No cranial nerve deficit. He exhibits normal muscle tone. Coordination normal.  Lethargic but purposeful movement. Non-communicative. Moves all extremities well.   Skin: Skin is warm and dry. No rash noted.  Psychiatric: He has a normal mood and affect. His behavior is normal. Judgment and thought content normal.  Nursing note and vitals reviewed.    ED Treatments / Results  DIAGNOSTIC STUDIES: Oxygen Saturation is 93% on RA, normal by my interpretation.    COORDINATION OF CARE: 1:35 AM- Pt advised of plan for treatment and pt agrees.  Labs (all labs ordered are listed, but only abnormal results are displayed) Labs Reviewed  COMPREHENSIVE METABOLIC PANEL - Abnormal; Notable for the following:       Result Value   Sodium 134 (*)    Chloride 99 (*)    Glucose, Bld 125 (*)    Calcium 8.8 (*)    AST 47 (*)    All other components within normal limits  CBC WITH DIFFERENTIAL/PLATELET - Abnormal; Notable for the following:    WBC 13.0 (*)     RBC 4.19 (*)    Neutro Abs 11.8 (*)    Lymphs Abs 0.6 (*)    All other components within normal limits  I-STAT CG4 LACTIC ACID, ED - Abnormal; Notable for the following:    Lactic Acid, Venous 2.66 (*)    All other components within normal limits  CULTURE, BLOOD (ROUTINE X 2)  CULTURE, BLOOD (ROUTINE X 2)  URINE CULTURE  URINALYSIS, ROUTINE W REFLEX MICROSCOPIC    EKG  EKG Interpretation  Date/Time:  Friday January 29 2016 01:48:47 EST Ventricular Rate:  101 PR Interval:    QRS Duration: 84 QT Interval:  309 QTC Calculation: 401 R Axis:   56 Text Interpretation:  Sinus tachycardia Otherwise within normal limits When compared with ECG of 08/19/2015, No significant change was found Confirmed by Cleveland Clinic Avon HospitalGLICK  MD, Chrisotpher Rivero ([id number]) on 01/30/2016 1:54:50 AM       Radiology Dg Chest Port 1 View  Result Date: 02/13/2016 CLINICAL DATA:  Fever and hypoxia. EXAM: PORTABLE CHEST 1 VIEW COMPARISON:  08/20/2015 FINDINGS: Volume loss in the left hemithorax with ill-defined left basilar patchy opacity. Minimal right infrahilar opacity with mild improvement compared to prior radiograph. Unchanged heart size and mediastinal contours. No large pleural effusion is seen. No pulmonary edema. The bones appear under mineralized. IMPRESSION: Patchy left base opacity. This may be pneumonia or aspiration. Right infrahilar opacity has improved from prior exam, with mild streaky recurrent or residual airspace disease. Electronically Signed   By: Rubye OaksMelanie  Ehinger M.D.   On: 01/27/2016 02:31    Procedures Procedures (including critical care time) CRITICAL CARE Performed by: GNFAO,ZHYQMGLICK,Dvora Buitron Total critical care time: 60 minutes Critical care time was exclusive of separately billable procedures and treating other patients. Critical care was necessary to treat or prevent imminent or life-threatening deterioration. Critical care was time spent personally by me on the following activities: development of treatment plan with  patient and/or surrogate as well as nursing, discussions with consultants, evaluation of patient's response to treatment, examination of patient, obtaining history from patient or surrogate, ordering and performing treatments and interventions, ordering and review of laboratory studies, ordering and review of radiographic studies, pulse oximetry and re-evaluation of  patient's condition.  Medications Ordered in ED Medications  sodium chloride 0.9 % bolus 1,000 mL (0 mLs Intravenous Stopped 02/09/2016 0322)    And  sodium chloride 0.9 % bolus 500 mL (500 mLs Intravenous New Bag/Given 01/25/2016 0326)    And  sodium chloride 0.9 % bolus 250 mL (250 mLs Intravenous New Bag/Given 01/28/2016 0327)  cefTRIAXone (ROCEPHIN) 1 g in dextrose 5 % 50 mL IVPB (1 g Intravenous New Bag/Given 02/20/2016 0325)  azithromycin (ZITHROMAX) 500 mg in dextrose 5 % 250 mL IVPB (not administered)  cefTRIAXone (ROCEPHIN) 1 g in dextrose 5 % 50 mL IVPB (not administered)  azithromycin (ZITHROMAX) 500 mg in dextrose 5 % 250 mL IVPB (0 mg Intravenous Stopped 01/22/2016 0322)     Initial Impression / Assessment and Plan / ED Course  I have reviewed the triage vital signs and the nursing notes.  Pertinent labs & imaging results that were available during my care of the patient were reviewed by me and considered in my medical decision making (see chart for details).  Clinical Course    Sepsis with hypoxia strongly suggestive of pneumonia. Old records are reviewed, and he does have a prior hospitalization in June of this year for aspiration pneumonia. Of note, during that hospitalization, blood pressure never got over 100 systolic. His current blood pressure is not felt to represent shock because it is in the range that his prior blood pressure readings have been. Code sepsis is activated. He is started on early goal-directed fluid therapy and is started on antibiotics for community-acquired pneumonia. Lactic acid has come back significantly  elevated at 2.66. Chest x-ray confirms left lower lobe pneumonia. Case is discussed with Dr. Katrinka BlazingSmith of [facility] who agrees to admit the patient to stepdown unit.  Final Clinical Impressions(s) / ED Diagnoses   Final diagnoses:  Community acquired pneumonia of left lower lobe of lung (HCC)  Sepsis, due to unspecified organism [facility](HCC)    New Prescriptions New Prescriptions   No medications on file   I personally performed the services described in this documentation, which was scribed in my presence. The recorded information has been reviewed and is accurate.       Dione Boozeavid Callen Vancuren, MD 10/06/15 [id number]

## 2016-01-29 NOTE — Progress Notes (Signed)
RT had to place patient on a venti mask due to lows oxygen sats. Patient had no success with the venti mask so RT placed patient on a non rebreather. Patient sats are now 95%

## 2016-01-29 NOTE — Care Management Note (Signed)
Case Management Note  Patient Details  Name: Shyrl NumbersSteven Logue MRN: [id number] Date of Birth: [birth year]  Subjective/Objective:       sepsis             Action/Plan: from home Date:  January 29, 2016 Chart reviewed for concurrent status and case management needs. Will continue to follow patient progress. Discharge Planning: following for needs Expected discharge date: [id number] Marcelle SmilingRhonda Davis, BSN, [place]3, [place]   [phone] Expected Discharge Date:                  Expected Discharge Plan:  Home/Self Care  In-House Referral:     Discharge planning Services     Post Acute Care Choice:    Choice offered to:     DME Arranged:    DME Agency:     HH Arranged:    HH Agency:     Status of Service:  In process, will continue to follow  If discussed at Long Length of Stay Meetings, dates discussed:    Additional Comments:  Golda AcreDavis, Rhonda Lynn, RN 02/02/2016, 10:47 AM

## 2016-01-29 NOTE — Progress Notes (Signed)
Initial Nutrition Assessment  DOCUMENTATION CODES:   Not applicable  INTERVENTION:  - Will order Ensure Enlive once/day, this supplement provides 350 kcal and 20 grams of protein. - Will order Magic Cup BID (with lunch and dinner), each supplement provides 290 kcal and 9 grams of protein - Will order daily liquid multivitamin. - Feeding assistance and encouragement with meals and supplements. - Will downgrade meal consistency to Dysphagia 1 as this is what pt is provided at Group Home. - Recommend 10-14 days of ascorbic acid and zinc supplementation for wound healing.  - RD will continue to monitor for needs.  NUTRITION DIAGNOSIS:   Increased nutrient needs related to wound healing as evidenced by estimated needs.  GOAL:   Patient will meet greater than or equal to 90% of their needs  MONITOR:   PO intake, Supplement acceptance, Weight trends, Labs, Skin, I & O's  REASON FOR ASSESSMENT:   Low Braden  ASSESSMENT:    65 y.o. male with medical history significant of Down syndrome, dysphagia, hypothyroidism, and hypothyroidism; who presented for fever up to 103F at the group home last night around 11:30 PM. Patient was noted to have O2 sats in the 87%  on room air by EMS. He was placed on 3 L of nasal cannula oxygen with improvement in O2 sats to 92%. There were notes of the patient having fallen earlier in the day at the group.  Pt seen for low Braden. BMI indicates normal weight. No family/visitors present at this time and pt is currently non-verbal during RD visit. No meal intakes documented since admission. Spoke with RN who reports pt ate a few bites of applesauce this AM and no other PO intakes. He states pt tolerated applesauce well and that, per report, pt is provided with pureed diet at group home.   Physical assessment shows mild fat wasting to upper arm which may be related to PMH. Unable to fully assess lower body as pt's legs were contracted and blankets were wrapped  around legs. No edema present. Per chart review, pt has lost 10 lbs (9% body weight) in the past 6 months which is not significant for time frame. Will continue to monitor weight closely as pt is at very high risk for malnutrition based on this weight loss.   Medications reviewed; 1 capsule Risaquad BID, 100 mcg oral Synthroid/day, 40 mg oral Protonix BID. Labs reviewed; Ca: 7.6 mg/dL, AST elevated.   IVF: NS @ 100 mL/hr.    Diet Order:  Diet Heart Room service appropriate? Yes; Fluid consistency: Thin  Skin:  Wound (see comment) (Stage 3 medial buttocks pressure injury)  Last BM:  12/7 (PTA)  Height:   Ht Readings from Last 1 Encounters:  12/01/15 4\' 10"  (1.473 m)    Weight:   Wt Readings from Last 1 Encounters:  12/01/15 105 lb 2.6 oz (47.7 kg)    Ideal Body Weight:  42.18 kg  BMI:  Body mass index is 21.98 kg/m.  Estimated Nutritional Needs:   Kcal:  1430-1620 (30-34 kcalkg)  Protein:  65-75 grams  Fluid:  >/= 1.5 L/day  EDUCATION NEEDS:   No education needs identified at this time    Trenton GammonJessica Riyah Bardon, MS, RD, LDN, CNSC Inpatient Clinical Dietitian Pager # [phone] After hours/weekend pager # [phone]

## 2016-01-29 NOTE — ED Notes (Signed)
Bed: AV40WA18 Expected date:  Expected time:  Means of arrival:  Comments: EMS 65 yo male fever 100.1-90/50 104-sat 87% RA-CBG 103

## 2016-01-29 NOTE — Progress Notes (Signed)
Pharmacy Antibiotic Note  Shyrl NumbersSteven Poulter is a 65 y.o. male admitted on 02/16/2016 with CAP.  Pharmacy has been consulted for Azithromycin, Ceftriaxone  dosing.  Plan: Azithromycin 500mg  iv q24hr Ceftriaxone 1gm iv q24hr       Temp (24hrs), Avg:101.5 F (38.6 C), Min:101.5 F (38.6 C), Max:101.5 F (38.6 C)  No results for input(s): WBC, CREATININE, LATICACIDVEN, VANCOTROUGH, VANCOPEAK, VANCORANDOM, GENTTROUGH, GENTPEAK, GENTRANDOM, TOBRATROUGH, TOBRAPEAK, TOBRARND, AMIKACINPEAK, AMIKACINTROU, AMIKACIN in the last 168 hours.  CrCl cannot be calculated (Patient's most recent lab result is older than the maximum 21 days allowed.).    No Known Allergies  Antimicrobials this admission: Ceftriaxone 12/8 >> Azithromycin 12/8 >>  Dose adjustments this admission: -  Microbiology results: pending  Thank you for allowing pharmacy to be a part of this patient's care.  Aleene DavidsonGrimsley Jr, Tania Steinhauser Crowford 01/27/2016 1:52 AM

## 2016-01-29 NOTE — H&P (Signed)
History and Physical    Jeremy Murillo ZOX:[id number] DOB: Feb 26, 1950 DOA: 02/11/2016  Referring MD/NP/PA: Dr. Preston Fleeting PCP: Lucretia Field  Patient coming from: Group home via EMS  Chief Complaint: Fever  HPI: Jeremy Murillo is a 65 y.o. male with medical history significant of Down syndrome, dysphagia, hypothyroidism, and hypothyroidism; who presented for fever up to 103F at the group home last night around 11:30 PM. Patient was noted to have O2 sats in the 87%  on room air by EMS. He was placed on 3 L of nasal cannula oxygen with improvement in O2 sats to 92%. There were notes of the patient having fallen earlier in the day at the group.   ED Course: Chest x-ray showing left patchy opacity suggestive of pneumonia. Sepsis protocol was initiated due to patient's fever and elevated lactic acid on admission. Patient received empiric antibiotics of ceftriaxone and azithromycin. Patient was placed on nasal cannula oxygen by facemask to maintain O2 saturations.  Review of Systems: Unable to obtain secondary to dementia and mental retardation.  Past Medical History:  Diagnosis Date  . Astigmatism   . Barrett's esophagus   . Dementia   . Diaphragmatic hernia without mention of obstruction or gangrene   . Down's syndrome   . Dysphagia, unspecified(787.20)   . Esophageal reflux   . Hypothyroid   . Stricture and stenosis of esophagus   . Unspecified hemorrhoids without mention of complication   . Unspecified hypothyroidism     Past Surgical History:  Procedure Laterality Date  . NONE       reports that he has never smoked. He has never used smokeless tobacco. He reports that he does not drink alcohol or use drugs.  No Known Allergies  Family History  Problem Relation Age of Onset  . Colon cancer Neg Hx   . Esophageal cancer Neg Hx   . Rectal cancer Neg Hx   . Stomach cancer Neg Hx     Prior to Admission medications   Medication Sig Start Date End Date Taking? Authorizing  Provider  acetaminophen (TYLENOL) 160 MG/5ML solution Take 640 mg by mouth every 4 (four) hours as needed for mild pain.   Yes Historical Provider, MD  acetaminophen (TYLENOL) 325 MG tablet Take 650 mg by mouth every 4 (four) hours as needed for mild pain or fever.    Yes Historical Provider, MD  alum & mag hydroxide-simeth (MAALOX/MYLANTA) 200-200-20 MG/5ML suspension Take by mouth as needed for indigestion or heartburn.   Yes Historical Provider, MD  bisacodyl (DULCOLAX) 10 MG suppository Place 10 mg rectally as needed for moderate constipation.   Yes Historical Provider, MD  carbamide peroxide (DEBROX) 6.5 % otic solution 5 drops 2 (two) times daily.   Yes Historical Provider, MD  coal tar (NEUTROGENA T-GEL) 0.5 % shampoo Apply 1 application topically every evening.    Yes Historical Provider, MD  Diethyltoluamide (OFF DEEP WOODS DRY) AERO Apply 1 application topically as needed.   Yes Historical Provider, MD  diphenhydrAMINE (BENADRYL) 25 MG tablet Take 25 mg by mouth every 4 (four) hours as needed.   Yes Historical Provider, MD  docusate sodium (COLACE) 100 MG capsule Take 100 mg by mouth at bedtime.   Yes Historical Provider, MD  esomeprazole (NEXIUM) 20 MG capsule Take 20 mg by mouth 2 (two) times daily before a meal.    Yes Historical Provider, MD  guaifenesin (ROBITUSSIN) 100 MG/5ML syrup Take 300 mg by mouth 3 (three) times daily as needed for cough.  Yes Historical Provider, MD  hydrocortisone (ANUSOL-HC) 25 MG suppository Place 25 mg rectally 3 (three) times daily as needed for hemorrhoids or itching.    Yes Historical Provider, MD  lactobacillus acidophilus (BACID) TABS tablet Take 1 tablet by mouth 2 (two) times daily.   Yes Historical Provider, MD  levothyroxine (SYNTHROID, LEVOTHROID) 100 MCG tablet Take 100 mcg by mouth daily before breakfast.   Yes Historical Provider, MD  lip balm (CARMEX) ointment Apply 1 application topically as needed for lip care.   Yes Historical Provider, MD    loperamide (IMODIUM A-D) 2 MG tablet Take 4 mg by mouth as needed for diarrhea or loose stools.    Yes Historical Provider, MD  magnesium hydroxide (MILK OF MAGNESIA) 400 MG/5ML suspension Take by mouth daily as needed for mild constipation.   Yes Historical Provider, MD  Memantine HCl-Donepezil HCl (NAMZARIC) 28-10 MG CP24 Take 1 capsule by mouth daily.   Yes Historical Provider, MD  metroNIDAZOLE (METROGEL) 1 % gel Apply 1 application topically daily.   Yes Historical Provider, MD  neomycin-bacitracin-polymyxin (NEOSPORIN) ointment Apply 1 application topically as needed for wound care. apply to eye   Yes Historical Provider, MD  promethazine (PHENERGAN) 25 MG tablet Take 25 mg by mouth every 4 (four) hours as needed for nausea or vomiting.   Yes Historical Provider, MD  simvastatin (ZOCOR) 10 MG tablet Take 10 mg by mouth at bedtime.   Yes Historical Provider, MD  Skin Protectants, Misc. (EUCERIN) cream Apply 1 application topically 2 (two) times daily.    Yes Historical Provider, MD  Vitamins A & D (VITAMIN A & D) ointment Apply 1 application topically as needed for dry skin.   Yes Historical Provider, MD  zinc oxide 20 % ointment Apply 1 application topically 3 (three) times daily. And PRN   Yes Historical Provider, MD  amoxicillin-clavulanate (AUGMENTIN) 875-125 MG tablet Take 1 tablet by mouth 2 (two) times daily. Patient not taking: Reported on 02/07/2016 08/21/15   Albertine Grates, MD  donepezil (ARICEPT) 5 MG tablet Take 1 tablet (5 mg total) by mouth daily. Patient not taking: Reported on 02/18/2016 11/17/14   Nilda Riggs, NP  feeding supplement, ENSURE ENLIVE, (ENSURE ENLIVE) LIQD Take 237 mLs by mouth 3 (three) times daily between meals. Patient not taking: Reported on 02/16/2016 08/21/15   Albertine Grates, MD  memantine (NAMENDA XR) 28 MG CP24 24 hr capsule Take 1 capsule (28 mg total) by mouth daily. Patient not taking: Reported on 01/28/2016 11/17/14   Nilda Riggs, NP    Physical  Exam:    Constitutional: Elderly male in NAD, able to follow simple commands with repeated attempts Vitals:   02/18/2016 0400 02/06/2016 0405 01/28/2016 0412 01/27/2016 0430  BP: 107/78  107/78 102/60  Pulse:  94 96 95  Resp:  16 18 16   Temp:      TempSrc:      SpO2:  93% 94% 90%   Eyes: PERRL, lids and conjunctivae normal ENMT: Mucous membranes are moist. Posterior pharynx clear of any exudate or lesions.  Neck: normal, supple, no masses, no thyromegaly Respiratory: Bilateral rhonchi appreciated in both lung fields. Cardiovascular: Regular rate and rhythm, no murmurs / rubs / gallops. No extremity edema. 2+ pedal pulses. No carotid bruits.  Abdomen: no tenderness, no masses palpated. No hepatosplenomegaly. Bowel sounds positive.  Musculoskeletal: no clubbing / cyanosis. No joint deformity upper and lower extremities. Good ROM, no contractures. Normal muscle tone.  Skin: Abrasion to leg and  contusion to patient for head with mild swelling noted. Neurologic: CN 2-12 grossly intact. Sensation intact, DTR normal. Strength 5/5 in all 4.  Psychiatric: Down syndrome with dementia  Labs on Admission: I have personally reviewed following labs and imaging studies  CBC:  Recent Labs Lab 2016-01-23 0143  WBC 13.0*  NEUTROABS 11.8*  HGB 13.4  HCT 39.6  MCV 94.5  PLT 193   Basic Metabolic Panel:  Recent Labs Lab 2016-01-23 0143  NA 134*  K 3.8  CL 99*  CO2 25  GLUCOSE 125*  BUN 16  CREATININE 1.14  CALCIUM 8.8*   GFR: CrCl cannot be calculated (Unknown ideal weight.). Liver Function Tests:  Recent Labs Lab 2016-01-23 0143  AST 47*  ALT 29  ALKPHOS 92  BILITOT 0.8  PROT 7.2  ALBUMIN 3.5   No results for input(s): LIPASE, AMYLASE in the last 168 hours. No results for input(s): AMMONIA in the last 168 hours. Coagulation Profile: No results for input(s): INR, PROTIME in the last 168 hours. Cardiac Enzymes: No results for input(s): CKTOTAL, CKMB, CKMBINDEX, TROPONINI in the  last 168 hours. BNP (last 3 results) No results for input(s): PROBNP in the last 8760 hours. HbA1C: No results for input(s): HGBA1C in the last 72 hours. CBG: No results for input(s): GLUCAP in the last 168 hours. Lipid Profile: No results for input(s): CHOL, HDL, LDLCALC, TRIG, CHOLHDL, LDLDIRECT in the last 72 hours. Thyroid Function Tests: No results for input(s): TSH, T4TOTAL, FREET4, T3FREE, THYROIDAB in the last 72 hours. Anemia Panel: No results for input(s): VITAMINB12, FOLATE, FERRITIN, TIBC, IRON, RETICCTPCT in the last 72 hours. Urine analysis:    Component Value Date/Time   COLORURINE YELLOW 08/19/2015 2127   APPEARANCEUR CLOUDY (A) 08/19/2015 2127   LABSPEC 1.016 08/19/2015 2127   PHURINE 5.5 08/19/2015 2127   GLUCOSEU NEGATIVE 08/19/2015 2127   HGBUR NEGATIVE 08/19/2015 2127   BILIRUBINUR NEGATIVE 08/19/2015 2127   KETONESUR NEGATIVE 08/19/2015 2127   PROTEINUR NEGATIVE 08/19/2015 2127   UROBILINOGEN 0.2 12/22/2009 1215   NITRITE POSITIVE (A) 08/19/2015 2127   LEUKOCYTESUR MODERATE (A) 08/19/2015 2127   Sepsis Labs: No results found for this or any previous visit (from the past 240 hour(s)).   Radiological Exams on Admission: Dg Chest Port 1 View  Result Date: 02/19/2016 CLINICAL DATA:  Fever and hypoxia. EXAM: PORTABLE CHEST 1 VIEW COMPARISON:  08/20/2015 FINDINGS: Volume loss in the left hemithorax with ill-defined left basilar patchy opacity. Minimal right infrahilar opacity with mild improvement compared to prior radiograph. Unchanged heart size and mediastinal contours. No large pleural effusion is seen. No pulmonary edema. The bones appear under mineralized. IMPRESSION: Patchy left base opacity. This may be pneumonia or aspiration. Right infrahilar opacity has improved from prior exam, with mild streaky recurrent or residual airspace disease. Electronically Signed   By: Rubye OaksMelanie  Ehinger M.D.   On: Feb 05, 2016 02:31    EKG: Independently reviewed. Sinus  tachycardia  Assessment/Plan Sepsis 2/2 Community acquired pneumonia vs aspiration pneumonia : Acute. Patient presented with fever of 103F, tachycardia, tachypnea, and elevated lactic acid level. Chest x-ray showing signs of possible pneumonia. - Admit to stepdown - Sepsis protocol initiated - Continue ceftriaxone and azithromycin per pharmacy  - Follow-up blood/sputum cultures   - Trend lactic acid levels  - Follow-up urinalysis  Transient hypotension patient found to have blood pressures as low as 84/57 on admission that appeared to respond to IV fluids. - Continue IV fluids 100 ml/hr - Continue to monitor  Hypothyroidism -  Continue levothyroxine  Dysphagia secondary to esophageal stricture - Continue pured diet per facility   Hyperlipidemia - Continue simvastatin  Down syndrome/ Dementia - Continue memantine-donepezil  GERD - Continue  DVT prophylaxis:Lovenox   Code Status: Full Family Communication: This 79 present at bedside Disposition Plan: Likely back to group home once medically stable  Consults called: none Admission status: Inpatient  Clydie Braunondell A Smith MD [facility] Pager [phone]- [phone]  If 7PM-7AM, please contact night-coverage [email or web address] Password [email or web address]  01/28/2016, 5:06 AM

## 2016-01-29 NOTE — Progress Notes (Signed)
[facility].  Pt in now on non-rebreather at 10L O2 with O2 sats at 90%.  Caregiver at bedside.

## 2016-01-30 ENCOUNTER — Inpatient Hospital Stay (HOSPITAL_COMMUNITY): Payer: Commercial Managed Care - HMO

## 2016-01-30 DIAGNOSIS — R6521 Severe sepsis with septic shock: Secondary | ICD-10-CM

## 2016-01-30 DIAGNOSIS — G9340 Encephalopathy, unspecified: Secondary | ICD-10-CM

## 2016-01-30 DIAGNOSIS — R1310 Dysphagia, unspecified: Secondary | ICD-10-CM

## 2016-01-30 DIAGNOSIS — J9601 Acute respiratory failure with hypoxia: Secondary | ICD-10-CM

## 2016-01-30 DIAGNOSIS — Q909 Down syndrome, unspecified: Secondary | ICD-10-CM

## 2016-01-30 DIAGNOSIS — A419 Sepsis, unspecified organism: Secondary | ICD-10-CM

## 2016-01-30 DIAGNOSIS — A403 Sepsis due to Streptococcus pneumoniae: Secondary | ICD-10-CM

## 2016-01-30 LAB — BLOOD GAS, ARTERIAL
ACID-BASE EXCESS: 0.5 mmol/L (ref 0.0–2.0)
BICARBONATE: 23.8 mmol/L (ref 20.0–28.0)
Drawn by: 331471
FIO2: 100
O2 Saturation: 96.4 %
PCO2 ART: 35.2 mmHg (ref 32.0–48.0)
PH ART: 7.445 (ref 7.350–7.450)
Patient temperature: 98.6
pO2, Arterial: 82.7 mmHg — ABNORMAL LOW (ref 83.0–108.0)

## 2016-01-30 LAB — CBC
HCT: 31.7 % — ABNORMAL LOW (ref 39.0–52.0)
HEMOGLOBIN: 10.7 g/dL — AB (ref 13.0–17.0)
MCH: 32 pg (ref 26.0–34.0)
MCHC: 33.8 g/dL (ref 30.0–36.0)
MCV: 94.9 fL (ref 78.0–100.0)
Platelets: 189 10*3/uL (ref 150–400)
RBC: 3.34 MIL/uL — ABNORMAL LOW (ref 4.22–5.81)
RDW: 15.1 % (ref 11.5–15.5)
WBC: 13.1 10*3/uL — ABNORMAL HIGH (ref 4.0–10.5)

## 2016-01-30 LAB — BASIC METABOLIC PANEL
Anion gap: 5 (ref 5–15)
BUN: 9 mg/dL (ref 6–20)
CALCIUM: 7.6 mg/dL — AB (ref 8.9–10.3)
CO2: 26 mmol/L (ref 22–32)
Chloride: 106 mmol/L (ref 101–111)
Creatinine, Ser: 0.96 mg/dL (ref 0.61–1.24)
GFR calc Af Amer: >60 mL/min (ref >60)
GLUCOSE: 129 mg/dL — AB (ref 65–99)
Potassium: 3.8 mmol/L (ref 3.5–5.1)
Sodium: 137 mmol/L (ref 135–145)

## 2016-01-30 LAB — PHOSPHORUS: PHOSPHORUS: 2 mg/dL — AB (ref 2.5–4.6)

## 2016-01-30 LAB — GLUCOSE, CAPILLARY: Glucose-Capillary: 167 mg/dL — ABNORMAL HIGH (ref 65–99)

## 2016-01-30 LAB — TSH: TSH: 4.945 u[IU]/mL — ABNORMAL HIGH (ref 0.350–4.500)

## 2016-01-30 LAB — PROCALCITONIN: Procalcitonin: 1.34 ng/mL

## 2016-01-30 LAB — CORTISOL: CORTISOL PLASMA: 17.8 ug/dL

## 2016-01-30 LAB — INFLUENZA PANEL BY PCR (TYPE A & B)
INFLAPCR: NEGATIVE
INFLBPCR: NEGATIVE

## 2016-01-30 LAB — MAGNESIUM: Magnesium: 1.6 mg/dL — ABNORMAL LOW (ref 1.7–2.4)

## 2016-01-30 MED ORDER — PHENYLEPHRINE HCL 10 MG/ML IJ SOLN
30.0000 ug/min | INTRAVENOUS | Status: DC
  Administered 2016-01-30: 30 ug/min via INTRAVENOUS
  Administered 2016-01-30 (×2): 110 ug/min via INTRAVENOUS
  Filled 2016-01-30 (×4): qty 1

## 2016-01-30 MED ORDER — HYDROCORTISONE NA SUCCINATE PF 100 MG IJ SOLR
50.0000 mg | Freq: Four times a day (QID) | INTRAMUSCULAR | Status: DC
  Administered 2016-01-30 – 2016-02-02 (×13): 50 mg via INTRAVENOUS
  Filled 2016-01-30 (×13): qty 2

## 2016-01-30 MED ORDER — OSELTAMIVIR PHOSPHATE 6 MG/ML PO SUSR
30.0000 mg | Freq: Two times a day (BID) | ORAL | Status: DC
  Administered 2016-01-30 – 2016-01-31 (×3): 30 mg via ORAL
  Filled 2016-01-30 (×3): qty 5

## 2016-01-30 MED ORDER — SODIUM CHLORIDE 0.9% FLUSH
10.0000 mL | Freq: Two times a day (BID) | INTRAVENOUS | Status: DC
  Administered 2016-01-30 – 2016-02-02 (×7): 10 mL

## 2016-01-30 MED ORDER — OSELTAMIVIR PHOSPHATE 6 MG/ML PO SUSR: 75.0000 mg | Freq: Two times a day (BID) | ORAL | Status: DC

## 2016-01-30 MED ORDER — SODIUM PHOSPHATES 45 MMOLE/15ML IV SOLN
30.0000 mmol | Freq: Once | INTRAVENOUS | Status: AC
  Administered 2016-01-30: 30 mmol via INTRAVENOUS
  Filled 2016-01-30: qty 10

## 2016-01-30 MED ORDER — PANTOPRAZOLE SODIUM 40 MG IV SOLR
40.0000 mg | Freq: Every day | INTRAVENOUS | Status: DC
  Administered 2016-01-30 – 2016-02-02 (×4): 40 mg via INTRAVENOUS
  Filled 2016-01-30 (×4): qty 40

## 2016-01-30 MED ORDER — MAGNESIUM SULFATE 2 GM/50ML IV SOLN
2.0000 g | Freq: Once | INTRAVENOUS | Status: AC
  Administered 2016-01-30: 2 g via INTRAVENOUS
  Filled 2016-01-30: qty 50

## 2016-01-30 MED ORDER — DEXTROSE 5 % IV SOLN
2.0000 g | INTRAVENOUS | Status: DC
  Administered 2016-01-30 – 2016-01-31 (×2): 2 g via INTRAVENOUS
  Filled 2016-01-30 (×2): qty 2

## 2016-01-30 MED ORDER — SODIUM CHLORIDE 0.9% FLUSH: 10.0000 mL | INTRAVENOUS | Status: DC | PRN

## 2016-01-30 MED ORDER — HALOPERIDOL LACTATE 5 MG/ML IJ SOLN
2.0000 mg | Freq: Once | INTRAMUSCULAR | Status: AC
  Administered 2016-01-30: 2 mg via INTRAVENOUS
  Filled 2016-01-30: qty 1

## 2016-01-30 NOTE — Progress Notes (Signed)
Pt placed on a non-rebreather due to low oxygen sats. Keeps removing mask and does not respond well to redirection. Received orders for 1 mg ativan, did not reduce symptoms. Continues to pull at lines and tubes.  Levophed running at 16, but due to pt movement and placement of IV pt keeps occluding line and BP drops. Movement and inacurrate BP readings making accurate titration of the Levophed difficult.  Will continue to monitor.

## 2016-01-30 NOTE — Progress Notes (Signed)
PHARMACY NOTE:  ANTIMICROBIAL RENAL DOSAGE ADJUSTMENT  Current antimicrobial regimen includes a mismatch between antimicrobial dosage and estimated renal function.  As per policy approved by the Pharmacy & Therapeutics and Medical Executive Committees, the antimicrobial dosage will be adjusted accordingly.  Current antimicrobial dosage:  Oseltamivir (Tamiflu)  Indication: Influenza  Renal Function:  Estimated Creatinine Clearance: 49.9 mL/min (by C-G formula based on SCr of 0.96 mg/dL).    Antimicrobial dosage has been changed to:  Oseltamivir 30mg  PO BID  Thank you for allowing pharmacy to be a part of this patient's care.  Lynann Beaverhristine Matis Monnier PharmD, BCPS Pager [phone] 01/30/2016 10:11 AM

## 2016-01-30 NOTE — Consult Note (Signed)
PULMONARY / CRITICAL CARE MEDICINE   Name: Jeremy Murillo MRN: [id number] DOB: 07/03/1950    ADMISSION DATE:  02/01/2016 CONSULTATION DATE: 01/30/16  REFERRING MD:  Dr Blake DivineAkula  CHIEF COMPLAINT:  Septic shock  HISTORY OF PRESENT ILLNESS:   65 year old Down syndrome patient from group home 01/27/2016. . Admitted with fever 103F and sepsis syndrome and hypoxemia wuith pulmonary infitlrates due to strep pneumonia. Started needing pressors yeserday via PIV and this morning 01/30/16  more obtunded  And more hypoxemic and PCCM consulted.   PAST MEDICAL HISTORY :  He  has a past medical history of Astigmatism; Barrett's esophagus; Dementia; Diaphragmatic hernia without mention of obstruction or gangrene; Down's syndrome; Dysphagia, unspecified(787.20); Esophageal reflux; Hypothyroid; Stricture and stenosis of esophagus; Unspecified hemorrhoids without mention of complication; and Unspecified hypothyroidism.  PAST SURGICAL HISTORY: He  has a past surgical history that includes NONE.  No Known Allergies  No current facility-administered medications on file prior to encounter.    Current Outpatient Prescriptions on File Prior to Encounter  Medication Sig  . acetaminophen (TYLENOL) 325 MG tablet Take 650 mg by mouth every 4 (four) hours as needed for mild pain or fever.   Marland Kitchen. alum & mag hydroxide-simeth (MAALOX/MYLANTA) 200-200-20 MG/5ML suspension Take by mouth as needed for indigestion or heartburn.  . bisacodyl (DULCOLAX) 10 MG suppository Place 10 mg rectally as needed for moderate constipation.  . carbamide peroxide (DEBROX) 6.5 % otic solution 5 drops 2 (two) times daily.  . coal tar (NEUTROGENA T-GEL) 0.5 % shampoo Apply 1 application topically every evening.   . Diethyltoluamide (OFF DEEP WOODS DRY) AERO Apply 1 application topically as needed.  . diphenhydrAMINE (BENADRYL) 25 MG tablet Take 25 mg by mouth every 4 (four) hours as needed.  . docusate sodium (COLACE) 100 MG capsule Take 100 mg by  mouth at bedtime.  Marland Kitchen. esomeprazole (NEXIUM) 20 MG capsule Take 20 mg by mouth 2 (two) times daily before a meal.   . hydrocortisone (ANUSOL-HC) 25 MG suppository Place 25 mg rectally 3 (three) times daily as needed for hemorrhoids or itching.   . levothyroxine (SYNTHROID, LEVOTHROID) 100 MCG tablet Take 100 mcg by mouth daily before breakfast.  . lip balm (CARMEX) ointment Apply 1 application topically as needed for lip care.  . loperamide (IMODIUM A-D) 2 MG tablet Take 4 mg by mouth as needed for diarrhea or loose stools.   . magnesium hydroxide (MILK OF MAGNESIA) 400 MG/5ML suspension Take by mouth daily as needed for mild constipation.  . metroNIDAZOLE (METROGEL) 1 % gel Apply 1 application topically daily.  Marland Kitchen. neomycin-bacitracin-polymyxin (NEOSPORIN) ointment Apply 1 application topically as needed for wound care. apply to eye  . promethazine (PHENERGAN) 25 MG tablet Take 25 mg by mouth every 4 (four) hours as needed for nausea or vomiting.  . simvastatin (ZOCOR) 10 MG tablet Take 10 mg by mouth at bedtime.  . Skin Protectants, Misc. (EUCERIN) cream Apply 1 application topically 2 (two) times daily.   . Vitamins A & D (VITAMIN A & D) ointment Apply 1 application topically as needed for dry skin.  Marland Kitchen. zinc oxide 20 % ointment Apply 1 application topically 3 (three) times daily. And PRN  . amoxicillin-clavulanate (AUGMENTIN) 875-125 MG tablet Take 1 tablet by mouth 2 (two) times daily. (Patient not taking: Reported on 01/24/2016)  . donepezil (ARICEPT) 5 MG tablet Take 1 tablet (5 mg total) by mouth daily. (Patient not taking: Reported on 01/23/2016)  . feeding supplement, ENSURE ENLIVE, (ENSURE  ENLIVE) LIQD Take 237 mLs by mouth 3 (three) times daily between meals. (Patient not taking: Reported on 02/10/16)  . memantine (NAMENDA XR) 28 MG CP24 24 hr capsule Take 1 capsule (28 mg total) by mouth daily. (Patient not taking: Reported on 2016/02/10)    FAMILY HISTORY:  His indicated that his mother is  deceased. He indicated that his father is deceased. He indicated that the status of his neg hx is unknown.    SOCIAL HISTORY: He  reports that he has never smoked. He has never used smokeless tobacco. He reports that he does not drink alcohol or use drugs.  VITAL SIGNS: BP (!) 77/35   Pulse 80   Temp (!) 101.4 F (38.6 C)   Resp 19   Ht 4\' 10"  (1.473 m)   Wt 47.7 kg (105 lb 2.6 oz)   SpO2 93%   BMI 21.98 kg/m   HEMODYNAMICS:    VENTILATOR SETTINGS:    INTAKE / OUTPUT: I/O last 3 completed shifts: In: 2304.3 [I.V.:2004.3; IV Piggyback:300] Out: 835 [Urine:835]  PHYSICAL EXAMINATION: General:  Down sydrome patient. Sill febrile Neuro:  RASS -3 equivalent without sedation gtt (was agitated yesterday) ,  GAG _ HEENT:  On face mask o2. Floppy neck with micrognathia - of downs Cardiovascular:  On levophed via PIV. Normal heart sounds. Tachycaric Lungs:  CTA bilaterally Abdomen:  soft Musculoskeletal:  No edema. No cyanosis Skin:  Intact on exposed areas  LABS:  PULMONARY  Recent Labs Lab 10-Feb-2016 0606  PHART 7.421  PCO2ART 36.0  PO2ART 79.5*  HCO3 22.7  O2SAT 95.1    CBC  Recent Labs Lab February 10, 2016 0143 02-10-16 0606 01/30/16 0349  HGB 13.4 11.9* 10.7*  HCT 39.6 35.9* 31.7*  WBC 13.0* 9.3 13.1*  PLT 193 163 189    COAGULATION No results for input(s): INR in the last 168 hours.  CARDIAC  No results for input(s): TROPONINI in the last 168 hours. No results for input(s): PROBNP in the last 168 hours.   CHEMISTRY  Recent Labs Lab 02/10/16 0143 February 10, 2016 0606 01/30/16 0349  NA 134* 137 137  K 3.8 4.1 3.8  CL 99* 105 106  CO2 25 26 26   GLUCOSE 125* 109* 129*  BUN 16 13 9   CREATININE 1.14 1.06 0.96  CALCIUM 8.8* 7.6* 7.6*   Estimated Creatinine Clearance: 49.9 mL/min (by C-G formula based on SCr of 0.96 mg/dL).   LIVER  Recent Labs Lab Feb 10, 2016 0143  AST 47*  ALT 29  ALKPHOS 92  BILITOT 0.8  PROT 7.2  ALBUMIN 3.5      INFECTIOUS  Recent Labs Lab 02-10-16 0156 10-Feb-2016 0606 02-10-2016 0739  LATICACIDVEN 2.66* 1.4 1.8  PROCALCITON  --  1.88  --      ENDOCRINE CBG (last 3)  No results for input(s): GLUCAP in the last 72 hours.       IMAGING x48h  - image(s) personally visualized  -   highlighted in bold Dg Chest Port 1 View  Result Date: 2016/02/10 CLINICAL DATA:  Hypoxia EXAM: PORTABLE CHEST 1 VIEW COMPARISON:  2016-02-10 FINDINGS: Airspace opacities noted in the left lower lobe and right lung base concerning for pneumonia. These have increased since prior study. Heart is borderline in size. No visible effusions or acute bony abnormality. IMPRESSION: Worsening bilateral lower lobe airspace opacities, left greater than right concerning for pneumonia. Electronically Signed   By: Charlett Nose M.D.   On: February 10, 2016 09:26   Dg Chest [facility] 1 [address]  Result Date: 02/08/2016 CLINICAL DATA:  Fever and hypoxia. EXAM: PORTABLE CHEST 1 VIEW COMPARISON:  08/20/2015 FINDINGS: Volume loss in the left hemithorax with ill-defined left basilar patchy opacity. Minimal right infrahilar opacity with mild improvement compared to prior radiograph. Unchanged heart size and mediastinal contours. No large pleural effusion is seen. No pulmonary edema. The bones appear under mineralized. IMPRESSION: Patchy left base opacity. This may be pneumonia or aspiration. Right infrahilar opacity has improved from prior exam, with mild streaky recurrent or residual airspace disease. Electronically Signed   By: Rubye OaksMelanie  Ehinger M.D.   On: 01/26/2016 02:31      ASSESSMENT / PLAN:  PULMONARY A: Acute hypoxemic resp failure due to strep pna in setting of downs - worsening since admit    P:   Face mask o2 Check abg Not a good bipap candidate Intubate if worsens  CARDIOVASCULAR A:  Septic shock  P:  Place picc Change levophed to neo via but can go baack to levophed via picdc cortisol  RENAL A:   At risk for AKI and  lyte issues P:   Fluids monitor  GASTROINTESTINAL A:   AT risk for aspiration P:   NPO plkace panda  HEMATOLOGIC A:   At riosk anemia P:  monitor  INFECTIOUS A:   STrep pneumo in setting of group home and early fluA otubreak in [place] P:   Change to high dose ceftriaxone Continue IV azithro empriic tamilu Order RVP and flu pcr  ENDOCRINE A:   AT risk hyperglycemia   P:   ssi  NEUROLOGIC A:   Obtunded v agitation  P:   Check abg RASS goal: 0    FAMILY  - Updates: None at bedside 01/30/2016   - Inter-disciplinary family meet or Palliative Care meeting due by:  day 7 which is 02/05/16    The patient is critically ill with multiple organ systems failure and requires high complexity decision making for assessment and support, frequent evaluation and titration of therapies, application of advanced monitoring technologies and extensive interpretation of multiple databases.   Critical Care Time devoted to patient care services described in this note is  45  Minutes. This time reflects time of care of this signee Dr Kalman ShanMurali Jerrye Seebeck. This critical care time does not reflect procedure time, or teaching time or supervisory time of PA/NP/Med student/Med Resident etc but could involve care discussion time    Dr. Kalman ShanMurali Shaquan Puerta, M.D., [facility].C.C.P Pulmonary and Critical Care Medicine Staff Physician [facility] [place] Pager: [phone], If no answer or between  15:00h - 7:00h: call [phone]  01/30/2016 9:48 AM

## 2016-01-30 NOTE — Progress Notes (Signed)
Peripherally Inserted Central Catheter/Midline Placement  The IV Nurse has discussed with the patient and/or persons authorized to consent for the patient, the purpose of this procedure and the potential benefits and risks involved with this procedure.  The benefits include less needle sticks, lab draws from the catheter, and the patient may be discharged home with the catheter. Risks include, but not limited to, infection, bleeding, blood clot (thrombus formation), and puncture of an artery; nerve damage and irregular heartbeat and possibility to perform a PICC exchange if needed/ordered by physician.  Alternatives to this procedure were also discussed.  Bard Power PICC patient education guide, fact sheet on infection prevention and patient information card has been provided to patient /or left at bedside.    PICC/Midline Placement Documentation  PICC Triple Lumen 01/30/16 Left Basilic 32 cm 0 cm (Active)  Indication for Insertion or Continuance of Line Prolonged intravenous therapies 01/30/2016  3:00 PM  Exposed Catheter (cm) 0 cm 01/30/2016  3:00 PM  Dressing Change Due 02/06/16 01/30/2016  3:00 PM    Telephone consesnt by Legal guardian Onnie BoerJennifer Clark   Reginia FortsLumban, Haskel Dewalt Albarece 01/30/2016, 3:06 PM

## 2016-01-30 NOTE — Progress Notes (Signed)
eLink Physician-Brief Progress Note Patient Name: Shyrl NumbersSteven Matthews DOB: 11-04-1950 MRN: [id number]   Date of Service  01/30/2016  HPI/Events of Note  Patient pulled out NGT.   eICU Interventions  Will order: 1. Bilateral wrist restraints.  2. Replace NGT.         Janelle Culton,Brodrick Dennard Nipugene 01/30/2016, 8:42 PM

## 2016-01-30 NOTE — Progress Notes (Signed)
Pt has now received 2 mg Haldol, slight relief. Safety sitter in room and able to constantly redirect pt. Oxygen sats are improving and BP readings more accurate.

## 2016-01-30 NOTE — Progress Notes (Signed)
PROGRESS NOTE    Jeremy NumbersSteven Murillo  YQM:[id number] DOB: 12/02/1950 DOA: 02/13/2016 PCP: Lucretia Fieldoyals, Hoover M   Brief Narrative: Jeremy Murillo is a 65 y.o. male with medical history significant of Down syndrome, dysphagia, hypothyroidism, and hypothyroidism; who presented with fever, chills, hypoxia. He was admitted for  Sepsis and acute respiratory failure with hypoxia secondary to bilateral streptococcal pneumonia and E COLI UTI. Patient was hypotensive all day yesterday, was started on IV pressors via peripheral line, today PCCM consulted for further eval.   Assessment & Plan:   Principal Problem:   Sepsis (HCC) Active Problems:   Hypothyroidism   DOWNS SYNDROME   Dysphagia   Hypotension   Community acquired pneumonia   GERD (gastroesophageal reflux disease)   Streptococcus pneumoniae infection   Sepsis due to group B Streptococcus (HCC)  Sepsis from streptococcal pneumonia and E COLI UTI: Not improving with IV fluids.  Started IV pressors.  PCCM consulted for recommendations.  PICC line placed.  On IV rocephin and zithromax.   Acute encephalopathy: Obtunded probably from sepsis, pneumonia and UTI vs sedatives given last night.  Continue to monitor.   Down's syndrome: Monitor.    Acute respiratory failure with hypoxia requiring oxygen secondary to streptococcal pneumonia.  On 100% NRB, with good sats.  ABG still shows low po2.  On IV antibiotics.   E coli UTI. Rocephin, urine cultures are pending.   Dysphagia Currently NPO.    Hypothyroidism:  Will change to IV synthroid.   GERD IV PPI.     DVT prophylaxis: (Lovenox/ Code Status: presumed FULL CODE.  Family Communication: no family at bedside.  Disposition Plan: remain in the step down.    Consultants:   CRITICAL CARE  Procedures: none.  Antimicrobials:  Rocephin and zithromax from 12/8  Subjective: Obtunded, no family at bedside.  Niece updated by RN.   Objective: Vitals:   01/30/16 0545  01/30/16 0600 01/30/16 0730 01/30/16 0800  BP: (!) 86/36 (!) 77/35 (!) 115/47 (!) 114/50  Pulse: 83 80 70 78  Resp: (!) 25 19 17 20   Temp:      TempSrc:      SpO2: 93% 93% 96% 95%  Weight:      Height:        Intake/Output Summary (Last 24 hours) at 01/30/16 1008 Last data filed at 01/30/16 0500  Gross per 24 hour  Intake          2216.76 ml  Output              835 ml  Net          1381.76 ml   Filed Weights   02/06/2016 0523  Weight: 47.7 kg (105 lb 2.6 oz)    Examination:  General exam: ON 100% NRB.  Respiratory system: diminished at bases.  Cardiovascular system: S1 & S2 heard, RRR. No JVD, murmurs, rubs, gallops or clicks. No pedal edema. Gastrointestinal system: Abdomen is nondistended, soft and nontender. No organomegaly or masses felt. Normal bowel sounds heard. Central nervous system: obtunded, not responding  Skin: No rashes, lesions or ulcers     Data Reviewed: I have personally reviewed following labs and imaging studies  CBC:  Recent Labs Lab 02/04/2016 0143 02/02/2016 0606 01/30/16 0349  WBC 13.0* 9.3 13.1*  NEUTROABS 11.8* 8.1*  --   HGB 13.4 11.9* 10.7*  HCT 39.6 35.9* 31.7*  MCV 94.5 94.7 94.9  PLT 193 163 189   Basic Metabolic Panel:  Recent Labs Lab 02/10/2016 0143 02/09/2016  0606 01/30/16 0349  NA 134* 137 137  K 3.8 4.1 3.8  CL 99* 105 106  CO2 25 26 26   GLUCOSE 125* 109* 129*  BUN 16 13 9   CREATININE 1.14 1.06 0.96  CALCIUM 8.8* 7.6* 7.6*   GFR: Estimated Creatinine Clearance: 49.9 mL/min (by C-G formula based on SCr of 0.96 mg/dL). Liver Function Tests:  Recent Labs Lab 29-Apr-2015 0143  AST 47*  ALT 29  ALKPHOS 92  BILITOT 0.8  PROT 7.2  ALBUMIN 3.5   No results for input(s): LIPASE, AMYLASE in the last 168 hours. No results for input(s): AMMONIA in the last 168 hours. Coagulation Profile: No results for input(s): INR, PROTIME in the last 168 hours. Cardiac Enzymes: No results for input(s): CKTOTAL, CKMB, CKMBINDEX,  TROPONINI in the last 168 hours. BNP (last 3 results) No results for input(s): PROBNP in the last 8760 hours. HbA1C: No results for input(s): HGBA1C in the last 72 hours. CBG: No results for input(s): GLUCAP in the last 168 hours. Lipid Profile: No results for input(s): CHOL, HDL, LDLCALC, TRIG, CHOLHDL, LDLDIRECT in the last 72 hours. Thyroid Function Tests: No results for input(s): TSH, T4TOTAL, FREET4, T3FREE, THYROIDAB in the last 72 hours. Anemia Panel: No results for input(s): VITAMINB12, FOLATE, FERRITIN, TIBC, IRON, RETICCTPCT in the last 72 hours. Sepsis Labs:  Recent Labs Lab 29-Apr-2015 0156 29-Apr-2015 0606 29-Apr-2015 0739  PROCALCITON  --  1.88  --   LATICACIDVEN 2.66* 1.4 1.8    Recent Results (from the past 240 hour(s))  Urine culture     Status: Abnormal (Preliminary result)   Collection Time: 29-Apr-2015  6:04 AM  Result Value Ref Range Status   Specimen Description URINE, CLEAN CATCH  Final   Special Requests NONE  Final   Culture >=100,000 COLONIES/mL ESCHERICHIA COLI (A)  Final   Report Status PENDING  Incomplete  MRSA PCR Screening     Status: None   Collection Time: 29-Apr-2015  6:05 AM  Result Value Ref Range Status   MRSA by PCR NEGATIVE NEGATIVE Final    Comment:        The GeneXpert MRSA Assay (FDA approved for NASAL specimens only), is one component of a comprehensive MRSA colonization surveillance program. It is not intended to diagnose MRSA infection nor to guide or monitor treatment for MRSA infections.   Culture, respiratory (NON-Expectorated)     Status: None (Preliminary result)   Collection Time: 29-Apr-2015  6:21 AM  Result Value Ref Range Status   Specimen Description TRACHEAL ASPIRATE  Final   Special Requests NONE  Final   Gram Stain   Final    ABUNDANT WBC PRESENT, PREDOMINANTLY PMN RARE SQUAMOUS EPITHELIAL CELLS PRESENT MODERATE GRAM POSITIVE COCCI IN PAIRS FEW GRAM NEGATIVE COCCOBACILLI    Culture   Final    CULTURE REINCUBATED FOR  BETTER GROWTH [facility]    Report Status PENDING  Incomplete         Radiology Studies: Dg Chest Port 1 View  Result Date: 02/21/2016 CLINICAL DATA:  Hypoxia EXAM: PORTABLE CHEST 1 VIEW COMPARISON:  2015/05/14 FINDINGS: Airspace opacities noted in the left lower lobe and right lung base concerning for pneumonia. These have increased since prior study. Heart is borderline in size. No visible effusions or acute bony abnormality. IMPRESSION: Worsening bilateral lower lobe airspace opacities, left greater than right concerning for pneumonia. Electronically Signed   By: Charlett NoseKevin  Dover M.D.   On: 2015/05/14 09:26   Dg Chest Port 1  View  Result Date: 01/28/2016 CLINICAL DATA:  Fever and hypoxia. EXAM: PORTABLE CHEST 1 VIEW COMPARISON:  08/20/2015 FINDINGS: Volume loss in the left hemithorax with ill-defined left basilar patchy opacity. Minimal right infrahilar opacity with mild improvement compared to prior radiograph. Unchanged heart size and mediastinal contours. No large pleural effusion is seen. No pulmonary edema. The bones appear under mineralized. IMPRESSION: Patchy left base opacity. This may be pneumonia or aspiration. Right infrahilar opacity has improved from prior exam, with mild streaky recurrent or residual airspace disease. Electronically Signed   By: Rubye Oaks M.D.   On: 02/14/2016 02:31        Scheduled Meds: . acidophilus  1 capsule Oral BID  . azithromycin  500 mg Intravenous Q24H  . cefTRIAXone (ROCEPHIN)  IV  2 g Intravenous Q24H  . memantine  28 mg Oral QAC breakfast   And  . donepezil  10 mg Oral QAC breakfast  . enoxaparin (LOVENOX) injection  40 mg Subcutaneous Q24H  . hydrocortisone sod succinate (SOLU-CORTEF) inj  50 mg Intravenous Q6H  . levothyroxine  100 mcg Oral QAC breakfast  . mouth rinse  15 mL Mouth Rinse BID  . multivitamin  15 mL Oral Daily  . oseltamivir  75 mg Oral BID  . pantoprazole (PROTONIX) IV  40 mg Intravenous  Daily  . simvastatin  10 mg Oral QHS   Continuous Infusions: . sodium chloride 100 mL/hr at 01/30/16 0510  . norepinephrine (LEVOPHED) Adult infusion 20 mcg/min (01/30/16 0610)  . phenylephrine (NEO-SYNEPHRINE) Adult infusion       LOS: 1 day    Time spent: 40 minutes.     Kathlen Mody, MD [facility] Pager [phone]   If 7PM-7AM, please contact night-coverage [email or web address] Password [email or web address] 01/30/2016, 10:08 AM

## 2016-01-31 ENCOUNTER — Inpatient Hospital Stay (HOSPITAL_COMMUNITY): Payer: Commercial Managed Care - HMO

## 2016-01-31 DIAGNOSIS — J189 Pneumonia, unspecified organism: Secondary | ICD-10-CM

## 2016-01-31 DIAGNOSIS — J9601 Acute respiratory failure with hypoxia: Secondary | ICD-10-CM

## 2016-01-31 LAB — RESPIRATORY PANEL BY PCR
ADENOVIRUS-RVPPCR: NOT DETECTED
Bordetella pertussis: NOT DETECTED
CHLAMYDOPHILA PNEUMONIAE-RVPPCR: NOT DETECTED
CORONAVIRUS HKU1-RVPPCR: NOT DETECTED
CORONAVIRUS NL63-RVPPCR: NOT DETECTED
CORONAVIRUS OC43-RVPPCR: NOT DETECTED
Coronavirus 229E: NOT DETECTED
INFLUENZA A-RVPPCR: NOT DETECTED
Influenza B: NOT DETECTED
MYCOPLASMA PNEUMONIAE-RVPPCR: NOT DETECTED
Metapneumovirus: NOT DETECTED
PARAINFLUENZA VIRUS 1-RVPPCR: NOT DETECTED
PARAINFLUENZA VIRUS 3-RVPPCR: NOT DETECTED
PARAINFLUENZA VIRUS 4-RVPPCR: NOT DETECTED
Parainfluenza Virus 2: NOT DETECTED
Respiratory Syncytial Virus: NOT DETECTED
Rhinovirus / Enterovirus: DETECTED — AB

## 2016-01-31 LAB — GLUCOSE, CAPILLARY: Glucose-Capillary: 173 mg/dL — ABNORMAL HIGH (ref 65–99)

## 2016-01-31 LAB — URINE CULTURE

## 2016-01-31 LAB — CREATININE, SERUM
CREATININE: 0.78 mg/dL (ref 0.61–1.24)
GFR calc Af Amer: >60 mL/min (ref >60)

## 2016-01-31 LAB — PROCALCITONIN: PROCALCITONIN: 0.89 ng/mL

## 2016-01-31 MED ORDER — HALOPERIDOL LACTATE 5 MG/ML IJ SOLN
2.0000 mg | Freq: Four times a day (QID) | INTRAMUSCULAR | Status: DC | PRN
  Administered 2016-01-31: 2 mg via INTRAVENOUS
  Filled 2016-01-31 (×2): qty 1

## 2016-01-31 MED ORDER — DEXTROSE 5 % IV SOLN
2.0000 g | INTRAVENOUS | Status: DC
  Filled 2016-01-31: qty 2

## 2016-01-31 MED ORDER — SODIUM CHLORIDE 0.9 % IV SOLN
1.0000 g | Freq: Three times a day (TID) | INTRAVENOUS | Status: DC
  Administered 2016-01-31 – 2016-02-02 (×6): 1 g via INTRAVENOUS
  Filled 2016-01-31 (×7): qty 1

## 2016-01-31 NOTE — Progress Notes (Signed)
PROGRESS NOTE    Jeremy Murillo  ONG:[id number] DOB: 05-10-1950 DOA: 02/15/2016 PCP: Lucretia Fieldoyals, [place] M   Brief Narrative: Jeremy Murillo is a 65 y.o. male with medical history significant of Down syndrome, dysphagia, hypothyroidism, and hypothyroidism; who presented with fever, chills, hypoxia. He was admitted for  Sepsis and acute respiratory failure with hypoxia secondary to bilateral streptococcal pneumonia and E COLI UTI. Patient was hypotensive all day yesterday, was started on IV pressors via peripheral line, PCCM consulted for further eval. He was started on IV hydrocortisone 50 m g every 6 hours.   Assessment & Plan:   Principal Problem:   Sepsis (HCC) Active Problems:   Hypothyroidism   DOWNS SYNDROME   Dysphagia   Hypotension   Community acquired pneumonia   GERD (gastroesophageal reflux disease)   Streptococcus pneumoniae infection   Sepsis due to group B Streptococcus (HCC)   Septic shock (HCC)   Acute respiratory failure with hypoxia (HCC)   Encephalopathy acute  Sepsis from streptococcal pneumonia and E COLI UTI: Improving.  Started IV pressors and IV hydrocortisone, plan to wean him off the pressors.   PCCM consulted for recommendations.  PICC line placed.  On IV rocephin and zithromax.   Acute encephalopathy: Much improved. Today he is alert and slightly agitated.   probably from sepsis, pneumonia and UTI vs sedatives. Continue to monitor.   Down's syndrome: Monitor.    Acute respiratory failure with hypoxia requiring oxygen secondary to streptococcal pneumonia.  On 100% NRB, with good sats.  ABG still shows low po2.  On IV antibiotics.   E coli UTI. Rocephin, urine cultures are pending.   Dysphagia Currently NPO.    Hypothyroidism:  Resume po synthroid.   GERD IV PPI.     DVT prophylaxis: (Lovenox/ Code Status: presumed FULL CODE.  Family Communication: no family at bedside. Called niece , could not leave message.  Disposition Plan:  remain in the step down.    Consultants:   CRITICAL CARE  Procedures: none.  Antimicrobials:  Rocephin and zithromax from 12/8  Subjective:  alert , agitated, in hand restraints.  Niece updated by RN.   Objective: Vitals:   01/31/16 1000 01/31/16 1100 01/31/16 1200 01/31/16 1300  BP: 118/67 (!) 111/52 (!) 122/57 108/70  Pulse: 74 67  69  Resp: (!) 22 (!) 31 (!) 28 (!) 24  Temp:   97.4 F (36.3 C)   TempSrc:   Axillary   SpO2: 94% 95% 92% 95%  Weight:      Height:        Intake/Output Summary (Last 24 hours) at 01/31/16 1413 Last data filed at 01/31/16 1300  Gross per 24 hour  Intake             4658 ml  Output             1450 ml  Net             3208 ml   Filed Weights   Nov 29, 2015 0523  Weight: 47.7 kg (105 lb 2.6 oz)    Examination:  General exam: ON 100% NRB.  Respiratory system: diminished at bases.  Cardiovascular system: S1 & S2 heard, RRR. No JVD, murmurs, rubs, gallops or clicks. No pedal edema. Gastrointestinal system: Abdomen is nondistended, soft and nontender. No organomegaly or masses felt. Normal bowel sounds heard. Central nervous system: alert and slightly agitated.  Skin: No rashes, lesions or ulcers     Data Reviewed: I have personally reviewed following labs and  imaging studies  CBC:  Recent Labs Lab 02/08/2016 0143 2016-02-08 0606 01/30/16 0349  WBC 13.0* 9.3 13.1*  NEUTROABS 11.8* 8.1*  --   HGB 13.4 11.9* 10.7*  HCT 39.6 35.9* 31.7*  MCV 94.5 94.7 94.9  PLT 193 163 189   Basic Metabolic Panel:  Recent Labs Lab Feb 08, 2016 0143 2016/02/08 0606 01/30/16 0349 01/30/16 1023 01/31/16 0329  NA 134* 137 137  --   --   K 3.8 4.1 3.8  --   --   CL 99* 105 106  --   --   CO2 25 26 26   --   --   GLUCOSE 125* 109* 129*  --   --   BUN 16 13 9   --   --   CREATININE 1.14 1.06 0.96  --  0.78  CALCIUM 8.8* 7.6* 7.6*  --   --   MG  --   --   --  1.6*  --   PHOS  --   --   --  2.0*  --    GFR: Estimated Creatinine Clearance: 59.9  mL/min (by C-G formula based on SCr of 0.78 mg/dL). Liver Function Tests:  Recent Labs Lab Feb 08, 2016 0143  AST 47*  ALT 29  ALKPHOS 92  BILITOT 0.8  PROT 7.2  ALBUMIN 3.5   No results for input(s): LIPASE, AMYLASE in the last 168 hours. No results for input(s): AMMONIA in the last 168 hours. Coagulation Profile: No results for input(s): INR, PROTIME in the last 168 hours. Cardiac Enzymes: No results for input(s): CKTOTAL, CKMB, CKMBINDEX, TROPONINI in the last 168 hours. BNP (last 3 results) No results for input(s): PROBNP in the last 8760 hours. HbA1C: No results for input(s): HGBA1C in the last 72 hours. CBG:  Recent Labs Lab 01/30/16 1955 01/31/16 0818  GLUCAP 167* 173*   Lipid Profile: No results for input(s): CHOL, HDL, LDLCALC, TRIG, CHOLHDL, LDLDIRECT in the last 72 hours. Thyroid Function Tests:  Recent Labs  01/30/16 1023  TSH 4.945*   Anemia Panel: No results for input(s): VITAMINB12, FOLATE, FERRITIN, TIBC, IRON, RETICCTPCT in the last 72 hours. Sepsis Labs:  Recent Labs Lab 2016-02-08 0156 08-Feb-2016 0606 02/08/2016 0739 01/30/16 1023 01/31/16 0329  PROCALCITON  --  1.88  --  1.34 0.89  LATICACIDVEN 2.66* 1.4 1.8  --   --     Recent Results (from the past 240 hour(s))  Blood Culture (routine x 2)     Status: None (Preliminary result)   Collection Time: 2016-02-08  1:44 AM  Result Value Ref Range Status   Specimen Description BLOOD RIGHT ANTECUBITAL  Final   Special Requests BOTTLES DRAWN AEROBIC AND ANAEROBIC  5 CC  Final   Culture   Final    NO GROWTH 1 DAY Performed at [facility]    Report Status PENDING  Incomplete  Blood Culture (routine x 2)     Status: None (Preliminary result)   Collection Time: 02-08-16  1:58 AM  Result Value Ref Range Status   Specimen Description BLOOD LEFT ANTECUBITAL  Final   Special Requests BOTTLES DRAWN AEROBIC AND ANAEROBIC 5 CC  Final   Culture   Final    NO GROWTH 1 DAY Performed at [facility]    Report Status PENDING  Incomplete  Urine culture     Status: Abnormal   Collection Time: Feb 08, 2016  6:04 AM  Result Value Ref Range Status   Specimen Description URINE, CLEAN CATCH  Final  Special Requests NONE  Final   Culture (A)  Final    >=100,000 COLONIES/mL ESCHERICHIA COLI Confirmed Extended Spectrum Beta-Lactamase Producer (ESBL) Performed at [facility]    Report Status 01/31/2016 FINAL  Final   Organism ID, Bacteria ESCHERICHIA COLI (A)  Final      Susceptibility   Escherichia coli - MIC*    AMPICILLIN >=32 RESISTANT Resistant     CEFAZOLIN >=64 RESISTANT Resistant     CEFTRIAXONE >=64 RESISTANT Resistant     CIPROFLOXACIN <=0.25 SENSITIVE Sensitive     GENTAMICIN <=1 SENSITIVE Sensitive     IMIPENEM <=0.25 SENSITIVE Sensitive     NITROFURANTOIN <=16 SENSITIVE Sensitive     TRIMETH/SULFA <=20 SENSITIVE Sensitive     AMPICILLIN/SULBACTAM >=32 RESISTANT Resistant     PIP/TAZO <=4 SENSITIVE Sensitive     Extended ESBL POSITIVE Resistant     * >=100,000 COLONIES/mL ESCHERICHIA COLI  MRSA PCR Screening     Status: None   Collection Time: 01/31/2016  6:05 AM  Result Value Ref Range Status   MRSA by PCR NEGATIVE NEGATIVE Final    Comment:        The GeneXpert MRSA Assay (FDA approved for NASAL specimens only), is one component of a comprehensive MRSA colonization surveillance program. It is not intended to diagnose MRSA infection nor to guide or monitor treatment for MRSA infections.   Culture, respiratory (NON-Expectorated)     Status: None (Preliminary result)   Collection Time: 01/26/2016  6:21 AM  Result Value Ref Range Status   Specimen Description TRACHEAL ASPIRATE  Final   Special Requests NONE  Final   Gram Stain   Final    ABUNDANT WBC PRESENT, PREDOMINANTLY PMN RARE SQUAMOUS EPITHELIAL CELLS PRESENT MODERATE GRAM POSITIVE COCCI IN PAIRS FEW GRAM NEGATIVE COCCOBACILLI    Culture   Final    MODERATE STREPTOCOCCUS  PNEUMONIAE SUSCEPTIBILITIES TO FOLLOW Performed at [facility]    Report Status PENDING  Incomplete  Respiratory Panel by PCR     Status: Abnormal   Collection Time: 01/30/16 11:01 AM  Result Value Ref Range Status   Adenovirus NOT DETECTED NOT DETECTED Final   Coronavirus 229E NOT DETECTED NOT DETECTED Final   Coronavirus HKU1 NOT DETECTED NOT DETECTED Final   Coronavirus NL63 NOT DETECTED NOT DETECTED Final   Coronavirus OC43 NOT DETECTED NOT DETECTED Final   Metapneumovirus NOT DETECTED NOT DETECTED Final   Rhinovirus / Enterovirus DETECTED (A) NOT DETECTED Final   Influenza A NOT DETECTED NOT DETECTED Final   Influenza B NOT DETECTED NOT DETECTED Final   Parainfluenza Virus 1 NOT DETECTED NOT DETECTED Final   Parainfluenza Virus 2 NOT DETECTED NOT DETECTED Final   Parainfluenza Virus 3 NOT DETECTED NOT DETECTED Final   Parainfluenza Virus 4 NOT DETECTED NOT DETECTED Final   Respiratory Syncytial Virus NOT DETECTED NOT DETECTED Final   Bordetella pertussis NOT DETECTED NOT DETECTED Final   Chlamydophila pneumoniae NOT DETECTED NOT DETECTED Final   Mycoplasma pneumoniae NOT DETECTED NOT DETECTED Final    Comment: Performed at [facility]         Radiology Studies: Dg Abd 1 View  Result Date: 01/30/2016 CLINICAL DATA:  NG tube placement. EXAM: ABDOMEN - 1 VIEW COMPARISON:  Radiograph earlier this day at 1245 hour FINDINGS: The weighted enteric tube is been removed. There is faint newly non weighted enteric tube tip and side-port below the diaphragm in the stomach. Unchanged bowel gas pattern with moderate stool distally. Tacks  from prior hernia repair on the right. Right lung base opacity is again seen. IMPRESSION: Tip and side port of the enteric tube below the diaphragm in the stomach. Electronically Signed   By: Rubye OaksMelanie  Ehinger M.D.   On: 01/30/2016 22:19   Dg Chest Port 1 View  Result Date: 01/31/2016 CLINICAL DATA:  Respiratory distress EXAM: PORTABLE  CHEST 1 VIEW COMPARISON:  May 06, 2015 FINDINGS: Gastric tube coiled in the stomach. Right PICC tip in the lower SVC. Diffuse bilateral airspace disease has progressed in the interval. Airspace disease is more severe on the right than the left. Interval development of small right effusion. IMPRESSION: Progression of bilateral airspace disease most likely pneumonia however edema is possible. Interval placement of right arm PICC with catheter in the lower SVC. Gastric tube coiled in the stomach. Electronically Signed   By: Marlan Palauharles  Clark M.D.   On: 01/31/2016 06:52   Dg Abd Portable 1v  Result Date: 01/30/2016 CLINICAL DATA:  Feeding tube placement. EXAM: PORTABLE ABDOMEN - 1 VIEW COMPARISON:  CT scan December 22, 2009 FINDINGS: The feeding tube makes 1 loop in the stomach before the distal tip terminates to the right of midline in the patient's known hiatal hernia. Bibasilar pulmonary opacities, better evaluated on yesterday's chest x-ray. No other abnormalities. IMPRESSION: 1. The feeding tube loops within the stomach 1 time before terminating with the distal tip in the hiatal hernia. 2. Bilateral pulmonary opacities. Chest x-ray could better evaluate. Electronically Signed   By: Gerome Samavid  Williams III M.D   On: 01/30/2016 13:21        Scheduled Meds: . acidophilus  1 capsule Oral BID  . azithromycin  500 mg Intravenous Q24H  . [START ON 02/01/2016] cefTRIAXone (ROCEPHIN)  IV  2 g Intravenous Q24H  . memantine  28 mg Oral QAC breakfast   And  . donepezil  10 mg Oral QAC breakfast  . enoxaparin (LOVENOX) injection  40 mg Subcutaneous Q24H  . hydrocortisone sod succinate (SOLU-CORTEF) inj  50 mg Intravenous Q6H  . levothyroxine  100 mcg Oral QAC breakfast  . mouth rinse  15 mL Mouth Rinse BID  . meropenem (MERREM) IV  1 g Intravenous Q8H  . multivitamin  15 mL Oral Daily  . pantoprazole (PROTONIX) IV  40 mg Intravenous Daily  . sodium chloride flush  10-40 mL Intracatheter Q12H   Continuous  Infusions: . sodium chloride 100 mL/hr at 01/31/16 1257  . norepinephrine (LEVOPHED) Adult infusion 5 mcg/min (01/31/16 1328)  . phenylephrine (NEO-SYNEPHRINE) Adult infusion Stopped (01/30/16 1741)     LOS: 2 days    Time spent: 40 minutes.     Kathlen ModyAKULA,Shuntavia Yerby, MD [facility] Pager [phone]   If 7PM-7AM, please contact night-coverage [email or web address] Password [email or web address] 01/31/2016, 2:13 PM

## 2016-01-31 NOTE — Progress Notes (Signed)
Updated niece, Victorino DikeJennifer, with how patient has been today and POC.  Ermelinda DasAudrianna Dayton Kenley

## 2016-01-31 NOTE — Progress Notes (Signed)
Pt sleeping at this time, RT will hold CPT.  RT to monitor and assess as needed.

## 2016-01-31 NOTE — Progress Notes (Signed)
eLink Physician-Brief Progress Note Patient Name: Jeremy NumbersSteven Acebo DOB: [birth year] MRN: [id number]   Date of Service  01/31/2016  HPI/Events of Note  Drop in sats on NRB to the mid to high 80s.  Chewing on tubes when trying to suction (baseline MR).  Have attempted NTS without success.  PCXR yesterday shows worsening interstitial pattern.  eICU Interventions  Plan: Attempt NTS again PCXR Duoneb Therapy vest to mobilize secretions.     Intervention Category Intermediate Interventions: Respiratory distress - evaluation and management  Ericberto Padget 01/31/2016, 12:01 AM

## 2016-01-31 NOTE — Progress Notes (Signed)
RT NTS pt and removed a copious amt of pink tinged/tan sputum.  Pt tolerated fairly well.  RT will monitor.

## 2016-01-31 NOTE — Consult Note (Signed)
PULMONARY / CRITICAL CARE MEDICINE   Name: Jeremy Murillo MRN: [id number] DOB: [birth year]    ADMISSION DATE:  02/11/2016 CONSULTATION DATE: 01/30/16  REFERRING MD:  Dr Blake DivineAkula  CHIEF COMPLAINT:  Septic shock  brief 65 year old Down syndrome patient from group home 02/11/2016. . Admitted with fever 103F and sepsis syndrome and hypoxemia wuith pulmonary infitlrates due to strep pneumonia. Started needing pressors yeserday via PIV and this morning 01/30/16  more obtunded  And more hypoxemic and PCCM consulted.     SUBJECTIVE/OVERNIGHT/INTERVAL HX 01/31/16 -- RN says baseline sbp is  > 90 based on her review of chart. Currently on levophed 13mcg and sbpp 118. Significant hypoxemia on face mask persists - improved with pulm toilet and position of floppy head. CXR with posssible new rt effusion. Now has PICC. FLu PCR negative but RVP pending  VITAL SIGNS: BP (!) 126/53 (BP Location: Left Arm)   Pulse 69   Temp 99.4 F (37.4 C) (Axillary)   Resp (!) 26   Ht 4\' 10"  (1.473 m)   Wt 47.7 kg (105 lb 2.6 oz)   SpO2 93%   BMI 21.98 kg/m   HEMODYNAMICS:    VENTILATOR SETTINGS: FiO2 (%):  [100 %] 100 %  INTAKE / OUTPUT: I/O last 3 completed shifts: In: 6936.3 [I.V.:6046.3; NG/GT:30; IV Piggyback:860] Out: 1150 [Urine:1150]  PHYSICAL EXAMINATION: General:  Down sydrome patient. LOOKS bettter Neuro:  RASS -2 equivalent without sedation gtt (was agitated yesterday) ,  GAG +. Occ gets agitated.  HEENT:  On face mask o2. 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 Normal heart sounds. Tachycaric Lungs:  CTA bilaterally Abdomen:  soft Musculoskeletal:  No edema. No cyanosis Skin:  Intact on exposed areas  LABS:  PULMONARY  Recent Labs Lab 02/19/2016 0606 01/30/16 1030  PHART 7.421 7.445  PCO2ART 36.0 35.2  PO2ART 79.5* 82.7*  HCO3 22.7 23.8  O2SAT 95.1 96.4    CBC  Recent Labs Lab 02/10/2016 0143 02/19/2016 0606 01/30/16 0349  HGB 13.4  11.9* 10.7*  HCT 39.6 35.9* 31.7*  WBC 13.0* 9.3 13.1*  PLT 193 163 189    COAGULATION No results for input(s): INR in the last 168 hours.  CARDIAC  No results for input(s): TROPONINI in the last 168 hours. No results for input(s): PROBNP in the last 168 hours.   CHEMISTRY  Recent Labs Lab 01/22/2016 0143 01/26/2016 0606 01/30/16 0349 01/30/16 1023 01/31/16 0329  NA 134* 137 137  --   --   K 3.8 4.1 3.8  --   --   CL 99* 105 106  --   --   CO2 25 26 26   --   --   GLUCOSE 125* 109* 129*  --   --   BUN 16 13 9   --   --   CREATININE 1.14 1.06 0.96  --  0.78  CALCIUM 8.8* 7.6* 7.6*  --   --   MG  --   --   --  1.6*  --   PHOS  --   --   --  2.0*  --    Estimated Creatinine Clearance: 59.9 mL/min (by C-G formula based on SCr of 0.78 mg/dL).   LIVER  Recent Labs Lab 02/06/2016 0143  AST 47*  ALT 29  ALKPHOS 92  BILITOT 0.8  PROT 7.2  ALBUMIN 3.5     INFECTIOUS  Recent Labs Lab 02/20/2016 0156 02/02/2016 0606 02/14/2016 0739 01/30/16 1023 01/31/16 0329  LATICACIDVEN 2.66* 1.4 1.8  --   --   PROCALCITON  --  1.88  --  1.34 0.89     ENDOCRINE CBG (last 3)   Recent Labs  01/30/16 1955 01/31/16 0818  GLUCAP 167* 173*         IMAGING x48h  - image(s) personally visualized  -   highlighted in bold Dg Abd 1 View  Result Date: 01/30/2016 CLINICAL DATA:  NG tube placement. EXAM: ABDOMEN - 1 VIEW COMPARISON:  Radiograph earlier this day at 1245 hour FINDINGS: The weighted enteric tube is been removed. There is faint newly non weighted enteric tube tip and side-port below the diaphragm in the stomach. Unchanged bowel gas pattern with moderate stool distally. Tacks from prior hernia repair on the right. Right lung base opacity is again seen. IMPRESSION: Tip and side port of the enteric tube below the diaphragm in the stomach. Electronically Signed   By: Rubye OaksMelanie  Ehinger M.D.   On: 01/30/2016 22:19   Dg Chest Port 1 View  Result Date: 01/31/2016 CLINICAL DATA:   Respiratory distress EXAM: PORTABLE CHEST 1 VIEW COMPARISON:  Nov 09, 2015 FINDINGS: Gastric tube coiled in the stomach. Right PICC tip in the lower SVC. Diffuse bilateral airspace disease has progressed in the interval. Airspace disease is more severe on the right than the left. Interval development of small right effusion. IMPRESSION: Progression of bilateral airspace disease most likely pneumonia however edema is possible. Interval placement of right arm PICC with catheter in the lower SVC. Gastric tube coiled in the stomach. Electronically Signed   By: Marlan Palauharles  Clark M.D.   On: 01/31/2016 06:52   Dg Abd Portable 1v  Result Date: 01/30/2016 CLINICAL DATA:  Feeding tube placement. EXAM: PORTABLE ABDOMEN - 1 VIEW COMPARISON:  CT scan December 22, 2009 FINDINGS: The feeding tube makes 1 loop in the stomach before the distal tip terminates to the right of midline in the patient's known hiatal hernia. Bibasilar pulmonary opacities, better evaluated on yesterday's chest x-ray. No other abnormalities. IMPRESSION: 1. The feeding tube loops within the stomach 1 time before terminating with the distal tip in the hiatal hernia. 2. Bilateral pulmonary opacities. Chest x-ray could better evaluate. Electronically Signed   By: Gerome Samavid  Williams III M.D   On: 01/30/2016 13:21      ASSESSMENT / PLAN:  PULMONARY A: Acute hypoxemic resp failure due to strep pna in setting of downs - worsening since admit as of 12/9   - improved clinically 12/10 but still on face mask and ? New right efusion. Pulm toilet helps   P:   Face mask o2 Pulm toilet Check CT chest Not a good bipap candidate Intubate if worsens  CARDIOVASCULAR A:  Septic shock    - 12/10 - baseline sbp > 90 per RN. Still on levophed P:  Goal Systolic range > 90 with MAP > 55 with  levophed  hydrocort to continue since 12/9  RENAL A:   At risk for AKI and lyte issues P:   Fluids monitor Check bmet and phos and mag  12/11  GASTROINTESTINAL A:   AT risk for aspiration P:   NPO  panda  HEMATOLOGIC A:   At risk anemia P:  monitor  INFECTIOUS A:   STrep pneumo in setting of group home and early fluA otubreak in [place]. Flu PCR neg 12/9  P:    high dose ceftriaxone Continue IV azithro Dc empriic tamilu await RVP  ENDOCRINE A:   AT risk hyperglycemia  P:   ssi  NEUROLOGIC A:   Obtunded v agitation  P:   Check abg RASS goal: 0    FAMILY  - Updates: None at bedside 01/31/2016   - Inter-disciplinary family meet or Palliative Care meeting due by:  day 7 which is 02/05/16    The patient is critically ill with multiple organ systems failure and requires high complexity decision making for assessment and support, frequent evaluation and titration of therapies, application of advanced monitoring technologies and extensive interpretation of multiple databases.   Critical Care Time devoted to patient care services described in this note is  45  Minutes. This time reflects time of care of this signee Dr Kalman Shan. This critical care time does not reflect procedure time, or teaching time or supervisory time of PA/NP/Med student/Med Resident etc but could involve care discussion time    Dr. Kalman Shan, M.D., [facility].C.P Pulmonary and Critical Care Medicine Staff Physician [facility] Pager: [phone], If no answer or between  15:00h - 7:00h: call [phone]  01/31/2016 10:06 AM

## 2016-02-01 ENCOUNTER — Inpatient Hospital Stay (HOSPITAL_COMMUNITY): Payer: Commercial Managed Care - HMO

## 2016-02-01 DIAGNOSIS — J181 Lobar pneumonia, unspecified organism: Secondary | ICD-10-CM

## 2016-02-01 DIAGNOSIS — R0600 Dyspnea, unspecified: Secondary | ICD-10-CM

## 2016-02-01 LAB — BASIC METABOLIC PANEL
ANION GAP: 4 — AB (ref 5–15)
Anion gap: 6 (ref 5–15)
BUN: 11 mg/dL (ref 6–20)
BUN: 13 mg/dL (ref 6–20)
CALCIUM: 7.7 mg/dL — AB (ref 8.9–10.3)
CALCIUM: 7.2 mg/dL — AB (ref 8.9–10.3)
CO2: 30 mmol/L (ref 22–32)
CO2: 28 mmol/L (ref 22–32)
CREATININE: 0.74 mg/dL (ref 0.61–1.24)
CREATININE: 0.72 mg/dL (ref 0.61–1.24)
Chloride: 105 mmol/L (ref 101–111)
Chloride: 112 mmol/L — ABNORMAL HIGH (ref 101–111)
GFR calc Af Amer: >60 mL/min (ref >60)
GFR calc non Af Amer: >60 mL/min (ref >60)
GLUCOSE: 107 mg/dL — AB (ref 65–99)
Glucose, Bld: 134 mg/dL — ABNORMAL HIGH (ref 65–99)
Potassium: 3.4 mmol/L — ABNORMAL LOW (ref 3.5–5.1)
Potassium: 3.3 mmol/L — ABNORMAL LOW (ref 3.5–5.1)
SODIUM: 141 mmol/L (ref 135–145)
Sodium: 144 mmol/L (ref 135–145)

## 2016-02-01 LAB — ECHOCARDIOGRAM COMPLETE
HEIGHTINCHES: 58 in
WEIGHTICAEL: 1682.55 [oz_av]

## 2016-02-01 LAB — GLUCOSE, CAPILLARY
GLUCOSE-CAPILLARY: 115 mg/dL — AB (ref 65–99)
Glucose-Capillary: 135 mg/dL — ABNORMAL HIGH (ref 65–99)
Glucose-Capillary: 114 mg/dL — ABNORMAL HIGH (ref 65–99)

## 2016-02-01 LAB — CBC WITH DIFFERENTIAL/PLATELET
Basophils Absolute: 0 10*3/uL (ref 0.0–0.1)
Basophils Relative: 0 %
EOS PCT: 0 %
Eosinophils Absolute: 0 10*3/uL (ref 0.0–0.7)
HCT: 30.8 % — ABNORMAL LOW (ref 39.0–52.0)
Hemoglobin: 10.5 g/dL — ABNORMAL LOW (ref 13.0–17.0)
LYMPHS ABS: 0.4 10*3/uL — AB (ref 0.7–4.0)
Lymphocytes Relative: 2 %
MCH: 31.9 pg (ref 26.0–34.0)
MCHC: 34.1 g/dL (ref 30.0–36.0)
MCV: 93.6 fL (ref 78.0–100.0)
MONO ABS: 1 10*3/uL (ref 0.1–1.0)
Monocytes Relative: 6 %
Neutro Abs: 16.9 10*3/uL — ABNORMAL HIGH (ref 1.7–7.7)
Neutrophils Relative %: 92 %
PLATELETS: 198 10*3/uL (ref 150–400)
RBC: 3.29 MIL/uL — AB (ref 4.22–5.81)
RDW: 14.6 % (ref 11.5–15.5)
WBC: 18.3 10*3/uL — AB (ref 4.0–10.5)

## 2016-02-01 LAB — CULTURE, RESPIRATORY

## 2016-02-01 LAB — PHOSPHORUS: Phosphorus: 1.9 mg/dL — ABNORMAL LOW (ref 2.5–4.6)

## 2016-02-01 LAB — CULTURE, RESPIRATORY W GRAM STAIN

## 2016-02-01 LAB — MAGNESIUM: MAGNESIUM: 2 mg/dL (ref 1.7–2.4)

## 2016-02-01 LAB — PROCALCITONIN: PROCALCITONIN: 0.51 ng/mL

## 2016-02-01 LAB — LEGIONELLA PNEUMOPHILA SEROGP 1 UR AG: L. pneumophila Serogp 1 Ur Ag: NEGATIVE

## 2016-02-01 MED ORDER — POTASSIUM CHLORIDE CRYS ER 20 MEQ PO TBCR: 40.0000 meq | EXTENDED_RELEASE_TABLET | Freq: Once | ORAL | Status: DC

## 2016-02-01 MED ORDER — POTASSIUM CHLORIDE 20 MEQ/15ML (10%) PO SOLN
40.0000 meq | Freq: Once | ORAL | Status: AC
  Administered 2016-02-01: 40 meq via ORAL
  Filled 2016-02-01: qty 30

## 2016-02-01 MED ORDER — SODIUM PHOSPHATES 45 MMOLE/15ML IV SOLN
30.0000 mmol | Freq: Once | INTRAVENOUS | Status: AC
  Administered 2016-02-01: 30 mmol via INTRAVENOUS
  Filled 2016-02-01: qty 10

## 2016-02-01 MED ORDER — INSULIN ASPART 100 UNIT/ML ~~LOC~~ SOLN
0.0000 [IU] | SUBCUTANEOUS | Status: DC
  Administered 2016-02-01: 1 [IU] via SUBCUTANEOUS

## 2016-02-01 MED ORDER — MORPHINE SULFATE (PF) 2 MG/ML IV SOLN
1.0000 mg | INTRAVENOUS | Status: DC | PRN
  Administered 2016-02-01: 1 mg via INTRAVENOUS
  Administered 2016-02-01 – 2016-02-02 (×6): 2 mg via INTRAVENOUS
  Filled 2016-02-01 (×7): qty 1

## 2016-02-01 NOTE — Progress Notes (Signed)
PULMONARY / CRITICAL CARE MEDICINE   Name: Jeremy Murillo MRN: [id number] DOB: 07-29-1950    ADMISSION DATE:  02/11/2016 CONSULTATION DATE: 01/30/16  REFERRING MD:  Dr Blake DivineAkula  CHIEF COMPLAINT:  Septic shock  BRIEF SUMMARY:  65 y/o M with PMH of Down's Syndrome, resides at a group home, who [facility] with fever to 103, hypoxia, bilateral infiltrates in the setting of positive urine strep antigen.  PCCM consulted 12/9 for hypotension and worsening hypoxia.  Levophed started (chart review shows SBP ~ 87 to low 100's).  Pressors weaned off 12/11 am.     SUBJECTIVE:  RN reports unable to get CT chest due to desaturations with lying flat, levophed turned off this am.  RVP positive for rhinovirus.      VITAL SIGNS: BP (!) 97/47   Pulse 86   Temp 99 F (37.2 C) (Axillary)   Resp (!) 25   Ht 4\' 10"  (1.473 m)   Wt 105 lb 2.6 oz (47.7 kg)   SpO2 93%   BMI 21.98 kg/m   HEMODYNAMICS:    VENTILATOR SETTINGS: FiO2 (%):  [100 %] 100 %  INTAKE / OUTPUT: I/O last 3 completed shifts: In: 5677.5 [I.V.:4777.5; NG/GT:150; IV Piggyback:750] Out: 1650 [Urine:1650]  PHYSICAL EXAMINATION: General:  Adult male, Down's Syndrome, on NRB Neuro:  Awake, alert to provider, appears weak HEENT:  MM pink/dry, no jvd, NRB in place, nasal trumpet in place Cardiovascular:  s1s2 rrr, no m/r/g Lungs:  Shallow, non-labored, faint wheeze, rhonchi bilaterally  Abdomen:  Soft, non-distended  Musculoskeletal:  No acute deformities  Skin:  Warm/dry, no edema  LABS:  PULMONARY  Recent Labs Lab 08/12/15 0606 01/30/16 1030  PHART 7.421 7.445  PCO2ART 36.0 35.2  PO2ART 79.5* 82.7*  HCO3 22.7 23.8  O2SAT 95.1 96.4    CBC  Recent Labs Lab 08/12/15 0606 01/30/16 0349 02/01/16 0417  HGB 11.9* 10.7* 10.5*  HCT 35.9* 31.7* 30.8*  WBC 9.3 13.1* 18.3*  PLT 163 189 198    COAGULATION No results for input(s): INR in the last 168 hours.  CARDIAC  No results for input(s): TROPONINI  in the last 168 hours. No results for input(s): PROBNP in the last 168 hours.   CHEMISTRY  Recent Labs Lab 08/12/15 0143 08/12/15 0606 01/30/16 0349 01/30/16 1023 01/31/16 0329 02/01/16 0417  NA 134* 137 137  --   --  141  K 3.8 4.1 3.8  --   --  3.4*  CL 99* 105 106  --   --  105  CO2 25 26 26   --   --  30  GLUCOSE 125* 109* 129*  --   --  134*  BUN 16 13 9   --   --  11  CREATININE 1.14 1.06 0.96  --  0.78 0.74  CALCIUM 8.8* 7.6* 7.6*  --   --  7.7*  MG  --   --   --  1.6*  --  2.0  PHOS  --   --   --  2.0*  --  1.9*   Estimated Creatinine Clearance: 59.9 mL/min (by C-G formula based on SCr of 0.74 mg/dL).   LIVER  Recent Labs Lab 08/12/15 0143  AST 47*  ALT 29  ALKPHOS 92  BILITOT 0.8  PROT 7.2  ALBUMIN 3.5     INFECTIOUS  Recent Labs Lab 08/12/15 0156  08/12/15 0606 08/12/15 0739 01/30/16 1023 01/31/16 0329 02/01/16 0417  LATICACIDVEN 2.66*  --  1.4 1.8  --   --   --  PROCALCITON  --   < > 1.88  --  1.34 0.89 0.51  < > = values in this interval not displayed.   ENDOCRINE CBG (last 3)   Recent Labs  01/30/16 1955 01/31/16 0818  GLUCAP 167* 173*     IMAGING: Dg Abd 1 View  Result Date: 01/30/2016 CLINICAL DATA:  NG tube placement. EXAM: ABDOMEN - 1 VIEW COMPARISON:  Radiograph earlier this day at 1245 hour FINDINGS: The weighted enteric tube is been removed. There is faint newly non weighted enteric tube tip and side-port below the diaphragm in the stomach. Unchanged bowel gas pattern with moderate stool distally. Tacks from prior hernia repair on the right. Right lung base opacity is again seen. IMPRESSION: Tip and side port of the enteric tube below the diaphragm in the stomach. Electronically Signed   By: Rubye Oaks M.D.   On: 01/30/2016 22:19   Dg Chest Port 1 View  Result Date: 01/31/2016 CLINICAL DATA:  Respiratory distress EXAM: PORTABLE CHEST 1 VIEW COMPARISON:  02/02/2016 FINDINGS: Gastric tube coiled in the stomach. Right  PICC tip in the lower SVC. Diffuse bilateral airspace disease has progressed in the interval. Airspace disease is more severe on the right than the left. Interval development of small right effusion. IMPRESSION: Progression of bilateral airspace disease most likely pneumonia however edema is possible. Interval placement of right arm PICC with catheter in the lower SVC. Gastric tube coiled in the stomach. Electronically Signed   By: Marlan Palau M.D.   On: 01/31/2016 06:52   Dg Abd Portable 1v  Result Date: 01/30/2016 CLINICAL DATA:  Feeding tube placement. EXAM: PORTABLE ABDOMEN - 1 VIEW COMPARISON:  CT scan December 22, 2009 FINDINGS: The feeding tube makes 1 loop in the stomach before the distal tip terminates to the right of midline in the patient's known hiatal hernia. Bibasilar pulmonary opacities, better evaluated on yesterday's chest x-ray. No other abnormalities. IMPRESSION: 1. The feeding tube loops within the stomach 1 time before terminating with the distal tip in the hiatal hernia. 2. Bilateral pulmonary opacities. Chest x-ray could better evaluate. Electronically Signed   By: Gerome Sam III M.D   On: 01/30/2016 13:21    EVENTS:  12/10 - Levophed /SBP 118. Significant hypoxemia on face mask persists - improved with pulm toilet and positioning. CXR with posssible new rt effusion. Now has PICC. FLu PCR negative but RVP pending  CULTURES: UC 12/8 >> 100k EColi, ESBL positive >> S- Imipenem BCx2 12/8 >>  U. Strep 12/8 >> positive  Sputum 12/8 >> strep pneumoniae >> S- rocephin  ASSESSMENT / PLAN:  PULMONARY A: Acute Hypoxemic Respiratory Failure - in the setting of bilateral airspace disease, strep pneumonia positive P:   High risk for intubation > would not use bipap in the event of decompensation Continue O2/NRB for now Pulmonary hygiene - vibration via bed Trend CXR Await CT chest, unable to complete due to desaturation when flat Duonebs Q4 PRN   CARDIOVASCULAR A:   Septic Shock - in setting of strep pneumoniae PNA, E-Coli ESBL UTI, improving Baseline BP - per chart review approx 85-100 systolic P:  Goal Systolic range > 90 with MAP > 55  Wean levophed to off  Continue stress steroids  ICU monitoring  See ID   RENAL A:   Hypokalemia  Hypophosphatemia  At Risk AKI - in setting of sepsis / hypotension  P:    NS @ 100 ml/hr Monitor BMP / UOP  Replace electrolytes as  indicated  GASTROINTESTINAL A:   At Risk Aspiration  P:   NPO NGT  PPI  HEMATOLOGIC A:   Anemia  P:  Trend CBC  Lovenox for DVT prophylaxis   INFECTIOUS A:   Septic Shock - secondary to Strep Pneumoniae PNA, ESBL EColi UTI, Rhinovirus  Strep Pneumoniae PNA Rhinovirus + ESBL E-Coli UTI  P:   Continue meropenem  Will treat as ESBL as patient is unable to verbalize symptoms Follow cultures as above Monitor fever curve / WBC Trend PCT  ENDOCRINE A:   At Risk Steroid Induced Hyperglycemia  Hypothyroidism  P:   SSI Q4, sensitive scale  Continue synthroid  NEUROLOGIC A:   Acute Encephalopathy  Down's Syndrome  P:   RASS goal: 0 Minimize sedating medications  D/C haldol  Continue namenda, aricept     FAMILY  - Updates: No family at bedside am 12/11.  Will attempt to contact [facility]OA to discuss plan of care.    - Inter-disciplinary family meet or Palliative Care meeting due by:  02/05/16   CC Time:  30 minutes   Canary BrimBrandi Lacora Folmer, NP-C [place] [facility] Pgr: [phone] or if no answer [phone] 02/01/2016, 10:59 AM

## 2016-02-01 NOTE — Progress Notes (Signed)
Called Jeremy Murillo at [phone] [facility] previous conversation with Dr. Kendrick FriesMcQuaid.  Jeremy LericheMark indicates he has spoken with his sister and they agree that we should continue full medical care but no aggressive measures in the event of respiratory or cardiac arrest.  Jeremy LericheMark indicates Jeremy Murillo has had a recent decline with progressive dementia and difficulty eating (forgetting to eat).    Plan: Place DNR  Continue current aggressive medical care Jeremy Murillo requests to be notified of any changes in Jeremy Murillo's status Will continue to monitor   Canary BrimBrandi Darrow Barreiro, NP-C  Pulmonary & Critical Care Pgr: [phone] or if no answer [phone] 02/01/2016, 3:36 PM

## 2016-02-01 NOTE — Progress Notes (Signed)
Called to bedside by RN to assess for desaturations / increased work of breathing.  Patients mental status has changed from prior assessment - more lethargic.  He has increased work of breathing and appears to be uncomfortable.  Gurgling noted.  RN reports unable to clear secretions with suctioning.  Rhonchi bilaterally.  Staff from the group home are at his bedside.  Called [facility]) to update on change in patients status.  Reviewed continued medical care but addition of morphine as needed for comfort / increased work of breathing.  If in am patient has continued decline, we will consider full comfort measures.    Canary BrimBrandi Bani Gianfrancesco, NP-C [place] [facility] Pgr: [phone] or if no answer [phone] 02/01/2016, 4:04 PM

## 2016-02-01 NOTE — Progress Notes (Signed)
Paged Merry ProudBrandi, NP to inform her pt o2 sat has dropped to mid 80s. She informed me she was able to contact family and patient has been made DNR. Attempt to NT suction if possible. She will come to assess patient.  Ermelinda DasAudrianna Kumiko Fishman

## 2016-02-01 NOTE — Progress Notes (Signed)
Date:  February 01, 2016 Chart reviewed for concurrent status and case management needs. Will continue to follow patient progress.  100% nrb mask for resp. support Discharge Planning: following for needs Expected discharge date: [id number] Marcelle SmilingRhonda Kholton Coate, BSN, [place]3, [place]   [phone]

## 2016-02-01 NOTE — Progress Notes (Signed)
Nurse attempted to transfer patient to CT. Patient's O2 sats dropped to low 80's with movement. Respiratory called. Nurse and respiratory agreed not to transfer patient at this time. Patient took a long time to recover to low 90's. Patient unable to lay flat without sat dropping.

## 2016-02-01 NOTE — Progress Notes (Signed)
Update given to niece, Victorino DikeJennifer. Will call Brandi, NP to give her Jennifer's number so they can discuss patient's POC.

## 2016-02-01 NOTE — Progress Notes (Signed)
Attempted to put patient on HFNC. O2 sats dropped to 82% sustained. Patient placed back on 10 L NRB.  [name]

## 2016-02-01 NOTE — Progress Notes (Signed)
  Echocardiogram 2D Echocardiogram has been performed.  Nolon RodBrown, Tony 02/01/2016, 4:01 PM

## 2016-02-01 NOTE — Progress Notes (Signed)
Paged Merry ProudBrandi, NP to give her Victorino DikeJennifer, pt's niece number.  Jeremy DasAudrianna Nicie Murillo

## 2016-02-01 NOTE — Progress Notes (Signed)
PROGRESS NOTE    Jeremy Murillo  ZOX:[id number] DOB: 1950-05-26 DOA: 02/21/2016 PCP: Lucretia Field   Brief Narrative: Jeremy Murillo is a 65 y.o. male with medical history significant of Down syndrome, dysphagia, hypothyroidism, and hypothyroidism; who presented with fever, chills, hypoxia. He was admitted for  Sepsis and acute respiratory failure with hypoxia secondary to bilateral streptococcal pneumonia and E COLI UTI. Patient was hypotensive all day yesterday, was started on IV pressors via peripheral line, PCCM consulted for further eval. He was started on IV hydrocortisone 50 m g every 6 hours.   Assessment & Plan:   Principal Problem:   Sepsis (HCC) Active Problems:   Hypothyroidism   DOWNS SYNDROME   Dysphagia   Hypotension   Community acquired pneumonia   GERD (gastroesophageal reflux disease)   Streptococcus pneumoniae infection   Sepsis due to group B Streptococcus (HCC)   Septic shock (HCC)   Acute respiratory failure with hypoxia (HCC)   Encephalopathy acute  Sepsis from streptococcal pneumonia and E COLI UTI: Not improving, his wbc count worsened to 18,000. He continues to have low grade fevers  t max of 100.  Started IV pressors and IV hydrocortisone, plan to wean him off the pressors if pressures remain adequate.  PCCM consulted for recommendations.  PICC line placed.    Acute encephalopathy: Not improving.  probably from sepsis, pneumonia and UTI vs sedatives. Continue to monitor.   Down's syndrome:    Acute respiratory failure with hypoxia requiring oxygen secondary to streptococcal pneumonia.  On 100% NRB, with sats between 12 's to low 90's ABG still shows low po2.  On IV antibiotics.   ESBL UTI: meropenam added by PCCM.   Dysphagia Currently NPO.  NG tube placed.    Hypothyroidism:  Resume synthroid  GERD IV PPI.     DVT prophylaxis: (Lovenox/ Code Status: DNR.  Family Communication: PCCM discussed with family, and he was made  DNR.  Disposition Plan: remain in the step down.    Consultants:   CRITICAL CARE  Procedures: none.  Antimicrobials:  Rocephin and zithromax from 12/8 TILL 12/11 meropenam from 12/11  Subjective: Lethargic, on NRB.   Objective: Vitals:   02/01/16 0700 02/01/16 0730 02/01/16 0759 02/01/16 0800  BP: (!) 104/56 (!) 122/57  (!) 155/76  Pulse: 69 67  73  Resp: 20 20 (!) 23 (!) 29  Temp:  98.9 F (37.2 C)  99 F (37.2 C)  TempSrc:  Axillary  Axillary  SpO2: 96% 97%  97%  Weight:      Height:        Intake/Output Summary (Last 24 hours) at 02/01/16 0904 Last data filed at 02/01/16 0800  Gross per 24 hour  Intake          4039.37 ml  Output              750 ml  Net          3289.37 ml   Filed Weights   02/14/2016 0523  Weight: 47.7 kg (105 lb 2.6 oz)    Examination:  General exam: ON 100% NRB.  Respiratory system: diminished at bases.  Cardiovascular system: S1 & S2 heard, RRR. No JVD, murmurs, rubs, gallops or clicks. No pedal edema. Gastrointestinal system: Abdomen is nondistended, soft and nontender. No organomegaly or masses felt. Normal bowel sounds heard. Central nervous system: lethargic, able to move his extremities to tactile stimulation. Skin: No rashes, lesions or ulcers     Data Reviewed: I have  personally reviewed following labs and imaging studies  CBC:  Recent Labs Lab 02/13/2016 0143 01/25/2016 0606 01/30/16 0349 02/01/16 0417  WBC 13.0* 9.3 13.1* 18.3*  NEUTROABS 11.8* 8.1*  --  16.9*  HGB 13.4 11.9* 10.7* 10.5*  HCT 39.6 35.9* 31.7* 30.8*  MCV 94.5 94.7 94.9 93.6  PLT 193 163 189 198   Basic Metabolic Panel:  Recent Labs Lab 02/16/2016 0143 02/20/2016 0606 01/30/16 0349 01/30/16 1023 01/31/16 0329 02/01/16 0417  NA 134* 137 137  --   --  141  K 3.8 4.1 3.8  --   --  3.4*  CL 99* 105 106  --   --  105  CO2 25 26 26   --   --  30  GLUCOSE 125* 109* 129*  --   --  134*  BUN 16 13 9   --   --  11  CREATININE 1.14 1.06 0.96  --  0.78  0.74  CALCIUM 8.8* 7.6* 7.6*  --   --  7.7*  MG  --   --   --  1.6*  --  2.0  PHOS  --   --   --  2.0*  --  1.9*   GFR: Estimated Creatinine Clearance: 59.9 mL/min (by C-G formula based on SCr of 0.74 mg/dL). Liver Function Tests:  Recent Labs Lab 01/28/2016 0143  AST 47*  ALT 29  ALKPHOS 92  BILITOT 0.8  PROT 7.2  ALBUMIN 3.5   No results for input(s): LIPASE, AMYLASE in the last 168 hours. No results for input(s): AMMONIA in the last 168 hours. Coagulation Profile: No results for input(s): INR, PROTIME in the last 168 hours. Cardiac Enzymes: No results for input(s): CKTOTAL, CKMB, CKMBINDEX, TROPONINI in the last 168 hours. BNP (last 3 results) No results for input(s): PROBNP in the last 8760 hours. HbA1C: No results for input(s): HGBA1C in the last 72 hours. CBG:  Recent Labs Lab 01/30/16 1955 01/31/16 0818  GLUCAP 167* 173*   Lipid Profile: No results for input(s): CHOL, HDL, LDLCALC, TRIG, CHOLHDL, LDLDIRECT in the last 72 hours. Thyroid Function Tests:  Recent Labs  01/30/16 1023  TSH 4.945*   Anemia Panel: No results for input(s): VITAMINB12, FOLATE, FERRITIN, TIBC, IRON, RETICCTPCT in the last 72 hours. Sepsis Labs:  Recent Labs Lab 01/23/2016 0156 02/10/2016 0606 02/04/2016 0739 01/30/16 1023 01/31/16 0329 02/01/16 0417  PROCALCITON  --  1.88  --  1.34 0.89 0.51  LATICACIDVEN 2.66* 1.4 1.8  --   --   --     Recent Results (from the past 240 hour(s))  Blood Culture (routine x 2)     Status: None (Preliminary result)   Collection Time: 02/05/2016  1:44 AM  Result Value Ref Range Status   Specimen Description BLOOD RIGHT ANTECUBITAL  Final   Special Requests BOTTLES DRAWN AEROBIC AND ANAEROBIC  5 CC  Final   Culture   Final    NO GROWTH 2 DAYS Performed at [facility]    Report Status PENDING  Incomplete  Blood Culture (routine x 2)     Status: None (Preliminary result)   Collection Time: 02/10/2016  1:58 AM  Result Value Ref Range Status     Specimen Description BLOOD LEFT ANTECUBITAL  Final   Special Requests BOTTLES DRAWN AEROBIC AND ANAEROBIC 5 CC  Final   Culture   Final    NO GROWTH 2 DAYS Performed at [facility]    Report Status PENDING  Incomplete  Urine culture     Status: Abnormal   Collection Time: 03-19-15  6:04 AM  Result Value Ref Range Status   Specimen Description URINE, CLEAN CATCH  Final   Special Requests NONE  Final   Culture (A)  Final    >=100,000 COLONIES/mL ESCHERICHIA COLI Confirmed Extended Spectrum Beta-Lactamase Producer (ESBL) [facility]    Report Status 01/31/2016 FINAL  Final   Organism ID, Bacteria ESCHERICHIA COLI (A)  Final      Susceptibility   Escherichia coli - MIC*    AMPICILLIN >=32 RESISTANT Resistant     CEFAZOLIN >=64 RESISTANT Resistant     CEFTRIAXONE >=64 RESISTANT Resistant     CIPROFLOXACIN <=0.25 SENSITIVE Sensitive     GENTAMICIN <=1 SENSITIVE Sensitive     IMIPENEM <=0.25 SENSITIVE Sensitive     NITROFURANTOIN <=16 SENSITIVE Sensitive     TRIMETH/SULFA <=20 SENSITIVE Sensitive     AMPICILLIN/SULBACTAM >=32 RESISTANT Resistant     PIP/TAZO <=4 SENSITIVE Sensitive     Extended ESBL POSITIVE Resistant     * >=100,000 COLONIES/mL ESCHERICHIA COLI  MRSA PCR Screening     Status: None   Collection Time: 03-19-15  6:05 AM  Result Value Ref Range Status   MRSA by PCR NEGATIVE NEGATIVE Final    Comment:        The GeneXpert MRSA Assay (FDA approved for NASAL specimens only), is one component of a comprehensive MRSA colonization surveillance program. It is not intended to diagnose MRSA infection nor to guide or monitor treatment for MRSA infections.   Culture, respiratory (NON-Expectorated)     Status: None (Preliminary result)   Collection Time: 03-19-15  6:21 AM  Result Value Ref Range Status   Specimen Description TRACHEAL ASPIRATE  Final   Special Requests NONE  Final   Gram Stain   Final    ABUNDANT WBC PRESENT,  PREDOMINANTLY PMN RARE SQUAMOUS EPITHELIAL CELLS PRESENT MODERATE GRAM POSITIVE COCCI IN PAIRS FEW GRAM NEGATIVE COCCOBACILLI    Culture   Final    MODERATE STREPTOCOCCUS PNEUMONIAE SUSCEPTIBILITIES TO FOLLOW [facility]formed [facility]    Report Status PENDING  Incomplete  Respiratory Panel by PCR     Status: Abnormal   Collection Time: 01/30/16 11:01 AM  Result Value Ref Range Status   Adenovirus NOT DETECTED NOT DETECTED Final   Coronavirus 229E NOT DETECTED NOT DETECTED Final   Coronavirus HKU1 NOT DETECTED NOT DETECTED Final   Coronavirus NL63 NOT DETECTED NOT DETECTED Final   Coronavirus OC43 NOT DETECTED NOT DETECTED Final   Metapneumovirus NOT DETECTED NOT DETECTED Final   Rhinovirus / Enterovirus DETECTED (A) NOT DETECTED Final   Influenza A NOT DETECTED NOT DETECTED Final   Influenza B NOT DETECTED NOT DETECTED Final   Parainfluenza Virus 1 NOT DETECTED NOT DETECTED Final   Parainfluenza Virus 2 NOT DETECTED NOT DETECTED Final   Parainfluenza Virus 3 NOT DETECTED NOT DETECTED Final   Parainfluenza Virus 4 NOT DETECTED NOT DETECTED Final   Respiratory Syncytial Virus NOT DETECTED NOT DETECTED Final   Bordetella pertussis NOT DETECTED NOT DETECTED Final   Chlamydophila pneumoniae NOT DETECTED NOT DETECTED Final   Mycoplasma pneumoniae NOT DETECTED NOT DETECTED Final    Comment: [facility]         Radiology Studies: Dg Abd 1 View  Result Date: 01/30/2016 CLINICAL DATA:  NG tube placement. EXAM: ABDOMEN - 1 VIEW COMPARISON:  Radiograph earlier this day at 1245 hour FINDINGS:  The weighted enteric tube is been removed. There is faint newly non weighted enteric tube tip and side-port below the diaphragm in the stomach. Unchanged bowel gas pattern with moderate stool distally. Tacks from prior hernia repair on the right. Right lung base opacity is again seen. IMPRESSION: Tip and side port of the enteric tube below the diaphragm in the stomach.  Electronically Signed   By: Rubye OaksMelanie  Ehinger M.D.   On: 01/30/2016 22:19   Dg Chest Port 1 View  Result Date: 01/31/2016 CLINICAL DATA:  Respiratory distress EXAM: PORTABLE CHEST 1 VIEW COMPARISON:  02/04/2016 FINDINGS: Gastric tube coiled in the stomach. Right PICC tip in the lower SVC. Diffuse bilateral airspace disease has progressed in the interval. Airspace disease is more severe on the right than the left. Interval development of small right effusion. IMPRESSION: Progression of bilateral airspace disease most likely pneumonia however edema is possible. Interval placement of right arm PICC with catheter in the lower SVC. Gastric tube coiled in the stomach. Electronically Signed   By: Marlan Palauharles  Clark M.D.   On: 01/31/2016 06:52   Dg Abd Portable 1v  Result Date: 01/30/2016 CLINICAL DATA:  Feeding tube placement. EXAM: PORTABLE ABDOMEN - 1 VIEW COMPARISON:  CT scan December 22, 2009 FINDINGS: The feeding tube makes 1 loop in the stomach before the distal tip terminates to the right of midline in the patient's known hiatal hernia. Bibasilar pulmonary opacities, better evaluated on yesterday's chest x-ray. No other abnormalities. IMPRESSION: 1. The feeding tube loops within the stomach 1 time before terminating with the distal tip in the hiatal hernia. 2. Bilateral pulmonary opacities. Chest x-ray could better evaluate. Electronically Signed   By: Gerome Samavid  Williams III M.D   On: 01/30/2016 13:21        Scheduled Meds: . acidophilus  1 capsule Oral BID  . azithromycin  500 mg Intravenous Q24H  . cefTRIAXone (ROCEPHIN)  IV  2 g Intravenous Q24H  . memantine  28 mg Oral QAC breakfast   And  . donepezil  10 mg Oral QAC breakfast  . enoxaparin (LOVENOX) injection  40 mg Subcutaneous Q24H  . hydrocortisone sod succinate (SOLU-CORTEF) inj  50 mg Intravenous Q6H  . levothyroxine  100 mcg Oral QAC breakfast  . mouth rinse  15 mL Mouth Rinse BID  . meropenem (MERREM) IV  1 g Intravenous Q8H  .  multivitamin  15 mL Oral Daily  . pantoprazole (PROTONIX) IV  40 mg Intravenous Daily  . sodium chloride flush  10-40 mL Intracatheter Q12H   Continuous Infusions: . sodium chloride 100 mL/hr at 02/01/16 0600  . norepinephrine (LEVOPHED) Adult infusion 5 mcg/min (02/01/16 0803)  . phenylephrine (NEO-SYNEPHRINE) Adult infusion Stopped (01/30/16 1741)     LOS: 3 days    Time spent: 30 minutes.     Kathlen ModyAKULA,Kirk Basquez, MD [facility] Pager [phone]   If 7PM-7AM, please contact night-coverage [email or web address] Password [email or web address] 02/01/2016, 9:04 AM

## 2016-02-02 LAB — MAGNESIUM: Magnesium: 1.9 mg/dL (ref 1.7–2.4)

## 2016-02-02 LAB — CBC
HEMATOCRIT: 27.3 % — AB (ref 39.0–52.0)
HEMOGLOBIN: 9.5 g/dL — AB (ref 13.0–17.0)
MCH: 32 pg (ref 26.0–34.0)
MCHC: 34.8 g/dL (ref 30.0–36.0)
MCV: 91.9 fL (ref 78.0–100.0)
Platelets: 165 10*3/uL (ref 150–400)
RBC: 2.97 MIL/uL — ABNORMAL LOW (ref 4.22–5.81)
RDW: 15 % (ref 11.5–15.5)
WBC: 12.1 10*3/uL — AB (ref 4.0–10.5)

## 2016-02-02 LAB — BASIC METABOLIC PANEL
Anion gap: 6 (ref 5–15)
BUN: 20 mg/dL (ref 6–20)
CALCIUM: 7.7 mg/dL — AB (ref 8.9–10.3)
CO2: 29 mmol/L (ref 22–32)
CREATININE: 0.85 mg/dL (ref 0.61–1.24)
Chloride: 112 mmol/L — ABNORMAL HIGH (ref 101–111)
GFR calc Af Amer: >60 mL/min (ref >60)
GLUCOSE: 106 mg/dL — AB (ref 65–99)
POTASSIUM: 3.4 mmol/L — AB (ref 3.5–5.1)
SODIUM: 147 mmol/L — AB (ref 135–145)

## 2016-02-02 LAB — GLUCOSE, CAPILLARY
GLUCOSE-CAPILLARY: 102 mg/dL — AB (ref 65–99)
GLUCOSE-CAPILLARY: 113 mg/dL — AB (ref 65–99)
GLUCOSE-CAPILLARY: 92 mg/dL (ref 65–99)
Glucose-Capillary: 110 mg/dL — ABNORMAL HIGH (ref 65–99)
Glucose-Capillary: 96 mg/dL (ref 65–99)
Glucose-Capillary: 97 mg/dL (ref 65–99)

## 2016-02-02 LAB — PHOSPHORUS: PHOSPHORUS: 2.8 mg/dL (ref 2.5–4.6)

## 2016-02-02 MED ORDER — SCOPOLAMINE 1 MG/3DAYS TD PT72
1.0000 | MEDICATED_PATCH | TRANSDERMAL | Status: DC
  Administered 2016-02-02: 1.5 mg via TRANSDERMAL
  Filled 2016-02-02: qty 1

## 2016-02-02 MED ORDER — LORAZEPAM 2 MG/ML IJ SOLN: 1.0000 mg | INTRAMUSCULAR | Status: DC | PRN

## 2016-02-02 NOTE — Progress Notes (Signed)
Notified Dr. Blake DivineAkula of patient's status and latest vital signs and interventions. Informed her that I spoke with Loraine LericheMark the nephew who is HPOA [phone] wanted to wait until this afternoon to consider palliative.   Ermelinda DasAudrianna Kia Varnadore

## 2016-02-02 NOTE — Progress Notes (Signed)
PROGRESS NOTE    Jeremy Murillo  ZOX:[id number] DOB: [birth year] DOA: 02/15/2016 PCP: Lucretia Field   Brief Narrative: Jeremy Murillo is a 65 y.o. male with medical history significant of Down syndrome, dysphagia, hypothyroidism, and hypothyroidism; who presented with fever, chills, hypoxia. He was admitted for  Sepsis and acute respiratory failure with hypoxia secondary to bilateral streptococcal pneumonia and E COLI UTI. PCCM consulted and recommendations given. oover the course of the hospitalization, he has deteriorated and required IV pressors, and has not maintained oxygen sats even on 100% oxygen. PCCM discussed with HCPOA, pt's family wanted DNR and comfort measures. We have discontinued antibiotics and transitioned patient to comfort care for dignity and quality of care.   Assessment & Plan:   Principal Problem:   Sepsis (HCC) Active Problems:   Hypothyroidism   DOWNS SYNDROME   Dysphagia   Hypotension   CAP (community acquired pneumonia)   GERD (gastroesophageal reflux disease)   Streptococcus pneumoniae infection   Sepsis due to group B Streptococcus (HCC)   Septic shock (HCC)   Acute respiratory failure with hypoxia (HCC)   Encephalopathy acute  Sepsis from streptococcal pneumonia and E COLI UTI: Not improving, his wbc count worsened to 18,000. He continues to have low grade fevers  t max of 100. We were able to wean him off the IV pressors but bp parameters are borderline.  PCCM, had a palliative meeting with the patient's HCPOA and transitioned him to comfort measures.    Acute encephalopathy: Not improving.  probably from sepsis, pneumonia and UTI vs sedatives.   Down's syndrome:   Acute respiratory failure with hypoxia requiring oxygen secondary to streptococcal pneumonia.  On 100% NRB, with sats in low 80's/   ESBL UTI: Was on 2 days of meropenam.   Dysphagia Comfort feeds as needed.    Hypothyroidism:    GERD     DVT prophylaxis:SCD'S Code  Status: DNR.  Family Communication: PCCM discussed with family, and he was made DNR. His hcpoa, wants hm to be transitioned to comfort care.  Disposition Plan: transfer out of step down and to med surg bed.    Consultants:   CRITICAL CARE  Procedures: none.  Antimicrobials:  Rocephin and zithromax from 12/8 TILL 12/11 meropenam from 12/11 till 12/12  Subjective: Lethargic, on NRB.   Objective: Vitals:   02/02/16 0700 02/02/16 0800 02/02/16 0900 02/02/16 1000  BP: 125/60 (!) 96/55 (!) 102/49 (!) 106/47  Pulse: 93 76 75 79  Resp: (!) 33 (!) 28 19 20   Temp:  98.9 F (37.2 C)    TempSrc:  Axillary    SpO2: (!) 85% (!) 89% 92% 90%  Weight:      Height:        Intake/Output Summary (Last 24 hours) at 02/02/16 1054 Last data filed at 02/02/16 1030  Gross per 24 hour  Intake          2859.33 ml  Output              650 ml  Net          2209.33 ml   Filed Weights   02/09/2016 0523  Weight: 47.7 kg (105 lb 2.6 oz)    Examination:  General exam: ON 100% NRB.  Respiratory system: diminished at bases. Rhonchi bilateral.  Cardiovascular system: S1 & S2 heard, RRR. No JVD, murmurs, rubs, gallops or clicks. No pedal edema. Gastrointestinal system: Abdomen is nondistended, soft and nontender. No organomegaly or masses felt. Normal bowel sounds  heard. Central nervous system: lethargic,  Skin: No rashes, lesions or ulcers     Data Reviewed: I have personally reviewed following labs and imaging studies  CBC:  Recent Labs Lab 01/23/2016 0143 02/11/2016 0606 01/30/16 0349 02/01/16 0417 02/02/16 0405  WBC 13.0* 9.3 13.1* 18.3* 12.1*  NEUTROABS 11.8* 8.1*  --  16.9*  --   HGB 13.4 11.9* 10.7* 10.5* 9.5*  HCT 39.6 35.9* 31.7* 30.8* 27.3*  MCV 94.5 94.7 94.9 93.6 91.9  PLT 193 163 189 198 165   Basic Metabolic Panel:  Recent Labs Lab 02/12/2016 0606 01/30/16 0349 01/30/16 1023 01/31/16 0329 02/01/16 0417 02/01/16 2030 02/02/16 0405 02/02/16 1000  NA 137 137  --   --   141 144  --  147*  K 4.1 3.8  --   --  3.4* 3.3*  --  3.4*  CL 105 106  --   --  105 112*  --  112*  CO2 26 26  --   --  30 28  --  29  GLUCOSE 109* 129*  --   --  134* 107*  --  106*  BUN 13 9  --   --  11 13  --  20  CREATININE 1.06 0.96  --  0.78 0.74 0.72  --  0.85  CALCIUM 7.6* 7.6*  --   --  7.7* 7.2*  --  7.7*  MG  --   --  1.6*  --  2.0  --  1.9  --   PHOS  --   --  2.0*  --  1.9*  --  2.8  --    GFR: Estimated Creatinine Clearance: 56.4 mL/min (by C-G formula based on SCr of 0.85 mg/dL). Liver Function Tests:  Recent Labs Lab 01/27/2016 0143  AST 47*  ALT 29  ALKPHOS 92  BILITOT 0.8  PROT 7.2  ALBUMIN 3.5   No results for input(s): LIPASE, AMYLASE in the last 168 hours. No results for input(s): AMMONIA in the last 168 hours. Coagulation Profile: No results for input(s): INR, PROTIME in the last 168 hours. Cardiac Enzymes: No results for input(s): CKTOTAL, CKMB, CKMBINDEX, TROPONINI in the last 168 hours. BNP (last 3 results) No results for input(s): PROBNP in the last 8760 hours. HbA1C: No results for input(s): HGBA1C in the last 72 hours. CBG:  Recent Labs Lab 02/01/16 1635 02/01/16 1953 02/02/16 0014 02/02/16 0405 02/02/16 0740  GLUCAP 114* 115* 110* 102* 96   Lipid Profile: No results for input(s): CHOL, HDL, LDLCALC, TRIG, CHOLHDL, LDLDIRECT in the last 72 hours. Thyroid Function Tests: No results for input(s): TSH, T4TOTAL, FREET4, T3FREE, THYROIDAB in the last 72 hours. Anemia Panel: No results for input(s): VITAMINB12, FOLATE, FERRITIN, TIBC, IRON, RETICCTPCT in the last 72 hours. Sepsis Labs:  Recent Labs Lab 01/27/2016 0156 02/17/2016 0606 02/01/2016 0739 01/30/16 1023 01/31/16 0329 02/01/16 0417  PROCALCITON  --  1.88  --  1.34 0.89 0.51  LATICACIDVEN 2.66* 1.4 1.8  --   --   --     Recent Results (from the past 240 hour(s))  Blood Culture (routine x 2)     Status: None (Preliminary result)   Collection Time: 02/21/2016  1:44 AM  Result  Value Ref Range Status   Specimen Description BLOOD RIGHT ANTECUBITAL  Final   Special Requests BOTTLES DRAWN AEROBIC AND ANAEROBIC  5 CC  Final   Culture   Final    NO GROWTH 3 DAYS Performed at [facility]  Report Status PENDING  Incomplete  Blood Culture (routine x 2)     Status: None (Preliminary result)   Collection Time: 02/20/2016  1:58 AM  Result Value Ref Range Status   Specimen Description BLOOD LEFT ANTECUBITAL  Final   Special Requests BOTTLES DRAWN AEROBIC AND ANAEROBIC 5 CC  Final   Culture   Final    NO GROWTH 3 DAYS Performed at [facility]    Report Status PENDING  Incomplete  Urine culture     Status: Abnormal   Collection Time: 02/17/2016  6:04 AM  Result Value Ref Range Status   Specimen Description URINE, CLEAN CATCH  Final   Special Requests NONE  Final   Culture (A)  Final    >=100,000 COLONIES/mL ESCHERICHIA COLI Confirmed Extended Spectrum Beta-Lactamase Producer (ESBL) Performed at [facility]    Report Status 01/31/2016 FINAL  Final   Organism ID, Bacteria ESCHERICHIA COLI (A)  Final      Susceptibility   Escherichia coli - MIC*    AMPICILLIN >=32 RESISTANT Resistant     CEFAZOLIN >=64 RESISTANT Resistant     CEFTRIAXONE >=64 RESISTANT Resistant     CIPROFLOXACIN <=0.25 SENSITIVE Sensitive     GENTAMICIN <=1 SENSITIVE Sensitive     IMIPENEM <=0.25 SENSITIVE Sensitive     NITROFURANTOIN <=16 SENSITIVE Sensitive     TRIMETH/SULFA <=20 SENSITIVE Sensitive     AMPICILLIN/SULBACTAM >=32 RESISTANT Resistant     PIP/TAZO <=4 SENSITIVE Sensitive     Extended ESBL POSITIVE Resistant     * >=100,000 COLONIES/mL ESCHERICHIA COLI  MRSA PCR Screening     Status: None   Collection Time: 02/04/2016  6:05 AM  Result Value Ref Range Status   MRSA by PCR NEGATIVE NEGATIVE Final    Comment:        The GeneXpert MRSA Assay (FDA approved for NASAL specimens only), is one component of a comprehensive MRSA colonization surveillance program.  It is not intended to diagnose MRSA infection nor to guide or monitor treatment for MRSA infections.   Culture, respiratory (NON-Expectorated)     Status: None   Collection Time: 02/12/2016  6:21 AM  Result Value Ref Range Status   Specimen Description TRACHEAL ASPIRATE  Final   Special Requests NONE  Final   Gram Stain   Final    ABUNDANT WBC PRESENT, PREDOMINANTLY PMN RARE SQUAMOUS EPITHELIAL CELLS PRESENT MODERATE GRAM POSITIVE COCCI IN PAIRS FEW GRAM NEGATIVE COCCOBACILLI Performed at [facility]    Culture MODERATE STREPTOCOCCUS PNEUMONIAE  Final   Report Status 02/01/2016 FINAL  Final   Organism ID, Bacteria STREPTOCOCCUS PNEUMONIAE  Final      Susceptibility   Streptococcus pneumoniae - MIC*    ERYTHROMYCIN <=0.12 SENSITIVE Sensitive     LEVOFLOXACIN 0.5 SENSITIVE Sensitive     PENICILLIN Value in next row Sensitive      SENSITIVE<=0.06    CEFTRIAXONE Value in next row Sensitive      SENSITIVE<=0.12    * MODERATE STREPTOCOCCUS PNEUMONIAE  Respiratory Panel by PCR     Status: Abnormal   Collection Time: 01/30/16 11:01 AM  Result Value Ref Range Status   Adenovirus NOT DETECTED NOT DETECTED Final   Coronavirus 229E NOT DETECTED NOT DETECTED Final   Coronavirus HKU1 NOT DETECTED NOT DETECTED Final   Coronavirus NL63 NOT DETECTED NOT DETECTED Final   Coronavirus OC43 NOT DETECTED NOT DETECTED Final   Metapneumovirus NOT DETECTED NOT DETECTED Final   Rhinovirus / Enterovirus DETECTED (  A) NOT DETECTED Final   Influenza A NOT DETECTED NOT DETECTED Final   Influenza B NOT DETECTED NOT DETECTED Final   Parainfluenza Virus 1 NOT DETECTED NOT DETECTED Final   Parainfluenza Virus 2 NOT DETECTED NOT DETECTED Final   Parainfluenza Virus 3 NOT DETECTED NOT DETECTED Final   Parainfluenza Virus 4 NOT DETECTED NOT DETECTED Final   Respiratory Syncytial Virus NOT DETECTED NOT DETECTED Final   Bordetella pertussis NOT DETECTED NOT DETECTED Final   Chlamydophila pneumoniae NOT  DETECTED NOT DETECTED Final   Mycoplasma pneumoniae NOT DETECTED NOT DETECTED Final    Comment: Performed at [facility]         Radiology Studies: Dg Chest Port 1 View  Result Date: 02/01/2016 CLINICAL DATA:  Fever, chills EXAM: PORTABLE CHEST 1 VIEW COMPARISON:  01/31/2016 FINDINGS: Bilateral lower lobe airspace disease concerning for pneumonia. Small right pleural effusion. No pneumothorax. Stable cardiomediastinal silhouette. Right-sided PICC line with the tip projecting over the SVC. Nasogastric tube projecting over the stomach. No acute osseous injury. IMPRESSION: 1. Bilateral lower lobe airspace disease concerning for pneumonia. Right pleural effusion. Followup PA and lateral chest X-ray is recommended in 3-4 weeks following trial of antibiotic therapy to ensure resolution and exclude underlying malignancy. Electronically Signed   By: Elige KoHetal  Patel   On: 02/01/2016 12:37        Scheduled Meds: . acidophilus  1 capsule Oral BID  . enoxaparin (LOVENOX) injection  40 mg Subcutaneous Q24H  . hydrocortisone sod succinate (SOLU-CORTEF) inj  50 mg Intravenous Q6H  . insulin aspart  0-9 Units Subcutaneous Q4H  . mouth rinse  15 mL Mouth Rinse BID  . multivitamin  15 mL Oral Daily  . pantoprazole (PROTONIX) IV  40 mg Intravenous Daily  . sodium chloride flush  10-40 mL Intracatheter Q12H   Continuous Infusions: . sodium chloride 100 mL/hr at 02/02/16 0600  . norepinephrine (LEVOPHED) Adult infusion Stopped (02/01/16 2030)     LOS: 4 days    Time spent: 30 minutes.     Kathlen ModyAKULA,Harvy Riera, MD [facility] Pager [phone]   If 7PM-7AM, please contact night-coverage [email or web address] Password [email or web address] 02/02/2016, 10:54 AM

## 2016-02-02 NOTE — Progress Notes (Signed)
[facility], pt's nephew and HPOA. He would like to speak with physician. Dr. Blake DivineAkula paged.   Jeremy DasAudrianna Brecklyn Murillo

## 2016-02-02 NOTE — Progress Notes (Signed)
Patient's o2 sats dropped to 50s, increased breathing effort, tachycardia, and worsened tachypnea. Canary BrimBrandi Ollis, NP on floor and notified her of changes. 2 of morphine given. Symptoms relieved some, o2 sats now in mid 70s, resp 23, HR low 100s. Notified niece, Victorino DikeJennifer and nephew Loraine LericheMark who is the HPOA.

## 2016-02-02 NOTE — Progress Notes (Signed)
PULMONARY / CRITICAL CARE MEDICINE   Name: Jeremy Murillo MRN: [id number] DOB: [birth year]    ADMISSION DATE:  01/26/2016 CONSULTATION DATE: 01/30/16  REFERRING MD:  Dr Blake DivineAkula  CHIEF COMPLAINT:  Septic shock  BRIEF SUMMARY:  65 y/o M with PMH of Down's Syndrome, resides at a group home, who [facility] with fever to 103, hypoxia, bilateral infiltrates in the setting of positive urine strep antigen.  PCCM consulted 12/9 for hypotension and worsening hypoxia.  Levophed started (chart review shows SBP ~ 87 to low 100's).  Pressors weaned off 12/11 am.     SUBJECTIVE:  Desaturations overnight, appears to be resting this AM   VITAL SIGNS: BP (!) 96/55 (BP Location: Left Arm)   Pulse 76   Temp 98.9 F (37.2 C) (Axillary)   Resp (!) 28   Ht 4\' 10"  (1.473 m)   Wt 105 lb 2.6 oz (47.7 kg)   SpO2 (!) 89%   BMI 21.98 kg/m   HEMODYNAMICS:    VENTILATOR SETTINGS:    INTAKE / OUTPUT: I/O last 3 completed shifts: In: 4978.5 [I.V.:3788.5; NG/GT:180; IV Piggyback:1010] Out: 1000 [Urine:1000]  PHYSICAL EXAMINATION: General:  Resting in bed HENT: NCAT OP Clear PULM: rhonchi bilaterally, normal effort CV: RRR, no mgr GI: BS+, soft, nontender MSK: no bony deformities Neuro: sleepy, minimal arousal to my exam  LABS:  PULMONARY  Recent Labs Lab 02/21/2016 0606 01/30/16 1030  PHART 7.421 7.445  PCO2ART 36.0 35.2  PO2ART 79.5* 82.7*  HCO3 22.7 23.8  O2SAT 95.1 96.4    CBC  Recent Labs Lab 01/30/16 0349 02/01/16 0417 02/02/16 0405  HGB 10.7* 10.5* 9.5*  HCT 31.7* 30.8* 27.3*  WBC 13.1* 18.3* 12.1*  PLT 189 198 165    COAGULATION No results for input(s): INR in the last 168 hours.  CARDIAC  No results for input(s): TROPONINI in the last 168 hours. No results for input(s): PROBNP in the last 168 hours.   CHEMISTRY  Recent Labs Lab 02/20/2016 0143 01/22/2016 0606 01/30/16 0349 01/30/16 1023 01/31/16 0329 02/01/16 0417 02/01/16 2030 02/02/16 0405   NA 134* 137 137  --   --  141 144  --   K 3.8 4.1 3.8  --   --  3.4* 3.3*  --   CL 99* 105 106  --   --  105 112*  --   CO2 25 26 26   --   --  30 28  --   GLUCOSE 125* 109* 129*  --   --  134* 107*  --   BUN 16 13 9   --   --  11 13  --   CREATININE 1.14 1.06 0.96  --  0.78 0.74 0.72  --   CALCIUM 8.8* 7.6* 7.6*  --   --  7.7* 7.2*  --   MG  --   --   --  1.6*  --  2.0  --  1.9  PHOS  --   --   --  2.0*  --  1.9*  --  2.8   Estimated Creatinine Clearance: 59.9 mL/min (by C-G formula based on SCr of 0.72 mg/dL).   LIVER  Recent Labs Lab 02/20/2016 0143  AST 47*  ALT 29  ALKPHOS 92  BILITOT 0.8  PROT 7.2  ALBUMIN 3.5     INFECTIOUS  Recent Labs Lab 01/28/2016 0156  02/01/2016 0606 02/14/2016 0739 01/30/16 1023 01/31/16 0329 02/01/16 0417  LATICACIDVEN 2.66*  --  1.4 1.8  --   --   --  PROCALCITON  --   < > 1.88  --  1.34 0.89 0.51  < > = values in this interval not displayed.   ENDOCRINE CBG (last 3)   Recent Labs  02/02/16 0014 02/02/16 0405 02/02/16 0740  GLUCAP 110* 102* 96     IMAGING: Dg Chest Port 1 View  Result Date: 02/01/2016 CLINICAL DATA:  Fever, chills EXAM: PORTABLE CHEST 1 VIEW COMPARISON:  01/31/2016 FINDINGS: Bilateral lower lobe airspace disease concerning for pneumonia. Small right pleural effusion. No pneumothorax. Stable cardiomediastinal silhouette. Right-sided PICC line with the tip projecting over the SVC. Nasogastric tube projecting over the stomach. No acute osseous injury. IMPRESSION: 1. Bilateral lower lobe airspace disease concerning for pneumonia. Right pleural effusion. Followup PA and lateral chest X-ray is recommended in 3-4 weeks following trial of antibiotic therapy to ensure resolution and exclude underlying malignancy. Electronically Signed   By: Elige KoHetal  Patel   On: 02/01/2016 12:37    EVENTS:  12/10 - Levophed 13mcg /SBP 118. Significant hypoxemia on face mask persists - improved with pulm toilet and positioning. CXR with  posssible new rt effusion. Now has PICC.   CULTURES: UC 12/8 >> 100k EColi, ESBL positive >> S- Imipenem BCx2 12/8 >>  U. Strep 12/8 >> positive  Sputum 12/8 >> strep pneumoniae >> S- rocephin  ASSESSMENT / PLAN:  PULMONARY A: Acute Hypoxemic Respiratory Failure - in the setting of severe community acquired pneumonia from strep pneumonia P:   Continue O2/NRB for now Pulmonary hygiene - vibration via bed > cancel 12/12 Morphine as needed for respiratory distress Duonebs Q4 PRN    INFECTIOUS A:   Septic Shock - resolved Strep Pneumoniae PNA Rhinovirus + ESBL E-Coli UTI  P:   Would consider stopping antibiotics   FAMILY  - Updates: Family (HCPOA Mark) updated at length on 12/11.  He voiced understanding of his situation and agreed with full comfort approach. See our notes.  PCCM will sign off, call if questions   Heber CarolinaBrent Glen Kesinger, MD [place] PCCM Pager: [phone] Cell: [phone] After 3pm or if no response, call [phone]

## 2016-02-03 LAB — GLUCOSE, CAPILLARY: Glucose-Capillary: 79 mg/dL (ref 65–99)

## 2016-02-03 LAB — CULTURE, BLOOD (ROUTINE X 2)
CULTURE: NO GROWTH
Culture: NO GROWTH

## 2016-02-22 NOTE — Progress Notes (Signed)
Nutrition Brief Note  Full RD assessment done on 12/8. Chart reviewed.  Pt now transitioning to comfort care per Dr. Darci NeedleAkula's note yesterday at 1054 which states: PCCM discussed with HCPOA, pt's family wanted DNR and comfort measures. We have discontinued antibiotics and transitioned patient to comfort care for dignity and quality of care.   No further nutrition interventions warranted at this time.  Please re-consult as needed.     Jeremy GammonJessica Adiana Smelcer, MS, RD, LDN, [facility] Inpatient Clinical Dietitian Pager # [phone] After hours/weekend pager # [phone]

## 2016-02-22 NOTE — Discharge Summary (Addendum)
Death Summary  Jeremy NumbersSteven Murillo ZOX:[id number] DOB: November 11, 1950 DOA: 02/02/2016  PCP: Lucretia Fieldoyals, [place] M   Admit date: 01/30/2016 Date of Death: 02/04/2016  Final Diagnoses:  Principal Problem:   Sepsis (HCC) Active Problems:   Hypothyroidism   DOWNS SYNDROME   Dysphagia   Hypotension   CAP (community acquired pneumonia)   GERD (gastroesophageal reflux disease)   Streptococcus pneumoniae infection   Sepsis due to group B Streptococcus (HCC)   Septic shock (HCC)   Acute respiratory failure with hypoxia (HCC)   Encephalopathy acute from sepsis.       History of present illness/ Hospital Course: :   Jeremy BertinSteven Murillo a 66 y.o.malewith medical history significant of Down syndrome, dysphagia, hypothyroidism,and hypothyroidism; who presented with fever, chills, hypoxia. He was found to be encephalopathic, and  admitted for  Sepsis and acute respiratory failure with hypoxia secondary to bilateral streptococcal pneumonia and E COLI UTI. PCCM consulted and recommendations given. oover the course of the hospitalization, he has deteriorated and required IV pressors, and has not maintained oxygen sats even on 100% oxygen. PCCM discussed with HCPOA, pt's family wanted DNR and comfort measures. We have discontinued antibiotics and transitioned patient to comfort care for dignity and quality of care. Patient passed away on 12/13 at 03:15 am.       Time: 03:15 am.   Signed:  Hailei Besser  [facility] 02/12/2016, 11:58 AM

## 2016-02-22 DEATH — deceased
